# Patient Record
Sex: Female | Born: 1985 | Race: White | Hispanic: No | Marital: Married | Smoking: Former smoker
Health system: Southern US, Community
[De-identification: ages and names within clinical notes are randomized; demographics above are authoritative.]

## PROBLEM LIST (undated history)

## (undated) DIAGNOSIS — G43009 Migraine without aura, not intractable, without status migrainosus: Secondary | ICD-10-CM

## (undated) DIAGNOSIS — J189 Pneumonia, unspecified organism: Secondary | ICD-10-CM

## (undated) DIAGNOSIS — O24419 Gestational diabetes mellitus in pregnancy, unspecified control: Secondary | ICD-10-CM

## (undated) DIAGNOSIS — T8859XA Other complications of anesthesia, initial encounter: Secondary | ICD-10-CM

## (undated) DIAGNOSIS — F32A Depression, unspecified: Secondary | ICD-10-CM

## (undated) DIAGNOSIS — F329 Major depressive disorder, single episode, unspecified: Secondary | ICD-10-CM

## (undated) DIAGNOSIS — F419 Anxiety disorder, unspecified: Secondary | ICD-10-CM

## (undated) DIAGNOSIS — T4145XA Adverse effect of unspecified anesthetic, initial encounter: Secondary | ICD-10-CM

## (undated) HISTORY — DX: Depression, unspecified: F32.A

## (undated) HISTORY — DX: Anxiety disorder, unspecified: F41.9

## (undated) HISTORY — DX: Major depressive disorder, single episode, unspecified: F32.9

## (undated) HISTORY — DX: Migraine without aura, not intractable, without status migrainosus: G43.009

## (undated) HISTORY — DX: Gestational diabetes mellitus in pregnancy, unspecified control: O24.419

---

## 2006-12-26 ENCOUNTER — Emergency Department: Payer: Self-pay | Admitting: Emergency Medicine

## 2012-01-27 HISTORY — PX: WISDOM TOOTH EXTRACTION: SHX21

## 2012-07-11 ENCOUNTER — Emergency Department: Payer: Self-pay | Admitting: Emergency Medicine

## 2012-07-11 LAB — COMPREHENSIVE METABOLIC PANEL
Albumin: 4.2 g/dL (ref 3.4–5.0)
BUN: 8 mg/dL (ref 7–18)
Bilirubin,Total: 0.5 mg/dL (ref 0.2–1.0)
Creatinine: 0.77 mg/dL (ref 0.60–1.30)
EGFR (African American): >60
Osmolality: 273 (ref 275–301)
SGOT(AST): 11 U/L — ABNORMAL LOW (ref 15–37)
Sodium: 138 mmol/L (ref 136–145)

## 2012-07-11 LAB — URINALYSIS, COMPLETE
Bilirubin,UR: NEGATIVE
Glucose,UR: NEGATIVE mg/dL (ref 0–75)
Nitrite: NEGATIVE
Ph: 8 (ref 4.5–8.0)
RBC,UR: 1 /HPF (ref 0–5)
Specific Gravity: 1.01 (ref 1.003–1.030)
Squamous Epithelial: 7
WBC UR: <1 /HPF (ref 0–5)

## 2012-07-11 LAB — CBC
MCH: 32.9 pg (ref 26.0–34.0)
MCHC: 35 g/dL (ref 32.0–36.0)
RBC: 4.27 10*6/uL (ref 3.80–5.20)
RDW: 13.4 % (ref 11.5–14.5)
WBC: 11.4 10*3/uL — ABNORMAL HIGH (ref 3.6–11.0)

## 2013-11-26 LAB — HM PAP SMEAR

## 2014-02-06 ENCOUNTER — Ambulatory Visit: Payer: Self-pay | Admitting: Obstetrics and Gynecology

## 2014-02-26 ENCOUNTER — Ambulatory Visit: Payer: Self-pay | Admitting: Obstetrics and Gynecology

## 2014-03-06 ENCOUNTER — Inpatient Hospital Stay: Payer: Self-pay

## 2014-05-22 DIAGNOSIS — F419 Anxiety disorder, unspecified: Principal | ICD-10-CM

## 2014-05-22 DIAGNOSIS — F329 Major depressive disorder, single episode, unspecified: Secondary | ICD-10-CM | POA: Insufficient documentation

## 2014-05-22 DIAGNOSIS — F32A Depression, unspecified: Secondary | ICD-10-CM

## 2014-05-27 NOTE — Consult Note (Signed)
PATIENT NAME:  Alexis Petersen, Alexis Petersen MR#:  [id number] DATE OF BIRTH:  1985-12-29  DATE OF CONSULTATION:  03/09/2014  REFERRING PHYSICIAN:  Prentice DockerMartin A. DeFrancesco, MD  CONSULTING PHYSICIAN:  Katharina Caperima Anatole Apollo, MD  REASON FOR CONSULTATION: The patient was admitted on 03/06/2014 for a C-section due to a 39.5 week intrauterine pregnancy. Consult was requested by Dr. Greggory KeeneFrancesco.   HISTORY OF PRESENT ILLNESS: DATE OF BIRTH: The patient is a 29 year old Caucasian female with history of gestational diabetes mellitus, history of tobacco abuse, and intrauterine growth retardation, who presented to the hospital for C-section at 39.5 weeks and underwent operation on 03/06/2014, has been coughing and producing some phlegm. Apparently, the patient was diagnosed with bronchitis, as well as ear infection and received some cephalosporin, at least 3 courses, over the past 3-4 months. Now, post C-section, she started coughing more and producing some phlegm. She denies any, however, significant fevers or chills. Admits of having some wheezing in her lungs, as well as some shortness of breath; however, states that her shortness of breath could be related to her nasal congestion, as well. The patient underwent x-ray evaluation of her lungs and was shown to have left lower lobe pneumonia. Hospitalist services were contacted for consultation.   PAST MEDICAL HISTORY: The patient has history of gestational diabetes mellitus, tobacco abuse, intrauterine growth retardation, C-section for 39.5 weeks intrauterine pregnancy.   MEDICATIONS: According to medical records, the patient is on lactated Ringer's solution IV, Norco as needed, Benadryl, Colace, iron sulfate, Dilaudid, ibuprofen, lidocaine patch, prenatal multivitamins, promethazine, levofloxacin.   ALLERGIES: THE PATIENT ADMITS OF ALLERGIES TO BENADRYL, AS WELL AS SULFA, AS WELL AS PENICILLIN.   PAST SURGICAL HISTORY: She also had wisdom teeth removed, and a lot of ear as well as  throat infections.   FAMILY HISTORY: Significant for history of mother who had stroke at a young age. She also had high blood pressure. The patient's father had a MI x 2 at the age of 31. No cancers. No diabetes in the family.   SOCIAL HISTORY: The patient is married, lives with her husband. Smoked 1 to 1-1/2 packs a day for 10 years, then quit, and now she smokes approximately 1-2 cigarettes a day, still smokes despite her pregnancy. Occasional alcohol use. She is unemployed at present. In the past, she was a cook.    REVIEW OF SYSTEMS:  CONSTITUTIONAL: Positive for weight loss, approximately 40 pounds, which she said was felt due to peptic ulcer disease.  EARS, NOSE, AND THROAT: She admits of having some blurry vision, because of allergies. Admits of having some sinus congestion. Also states that she has year-round allergies, admits of cough, as well as wheezes, as well as shortness of breath, some whitish thick phlegm. Denies any high  fevers, fatigue, weakness, pains, double vision, glaucoma, or cataracts. Denies any tinnitus, allergies, epistaxis, sinus pain, dentures difficulty swallowing.  RESPIRATORY: Denies any hemoptysis, asthma, COPD.  CARDIOVASCULAR: Denies chest pains, orthopnea, edema, arrhythmias, palpitations or syncope.  GASTROINTESTINAL:  Denies any nausea, vomiting, diarrhea, or constipation.  GENITOURINARY: Denies dysuria, hematuria, frequency, incontinence.  ENDOCRINOLOGY: Denies polydipsia, nocturia, thyroid problems, heat or cold intolerance, or thirst.  HEMATOLOGICAL: Denies anemia, easy bruising, bleeding, swollen glands.  SKIN: Denies any acne, rashes, lesions, moles.  MUSCULOSKELETAL: Denies arthritis, joint swelling.  NEUROLOGICAL: No numbness, epilepsy, or tremors.  PSYCHIATRIC: Denies anxiety, insomnia, or depression.   PHYSICAL EXAMINATION: VITAL SIGNS: During my evaluation, the patient's vital signs: Temperature 98.3, pulse was 95, respiration was 18, blood  pressure 110/70, saturation was 98% on room air at rest.  GENERAL: This is a well-developed, well-nourished, mildly obese Caucasian female in no significant distress, sitting on the stretcher.  HEENT: Her pupils are equal, reactive to light. Extraocular muscles intact. No icterus or conjunctivitis. Has normal hearing. No pharyngeal erythema. Mucosa is moist.  NECK: No masses. Supple, nontender. Thyroid not enlarged. No adenopathy. No JVD or carotid bruits bilaterally. Full  range of motion. LUNGS: :Rales and rhonchi were heard in the left side, somewhat diminished breath sounds, but  otherwise no significant wheezing. No labored inspirations, increased effort, dullness to percussion. Not in overt respiratory distress, quite relatively quiet, no significant rales, a few rhonchi were heard on the right side.  CARDIOVASCULAR: S1, S2 appreciated. The rhythm is regular. PMI not lateralized. Chest is nontender to palpation.  EXTREMITIES: 1+ pedal pulses. Trace to 1+ lower extremity edema. No calf tenderness or cyanosis noted.  ABDOMEN: Soft, nontender. Bowel sounds are present. No hepatosplenomegaly or masses were noted. The patient does have lower abdominal area incision, which was dressed and no bleeding, no significant swelling was noted, or pain on palpation. Bowel sounds were present.  MUSCULOSKELETAL: Able to move all extremities. No cyanosis, degenerative joint disease, or kyphosis. Gait was not tested.  SKIN: Did not reveal any rashes, lesions, erythema, nodularity, or induration. It was warm and dry to palpation.  LYMPHATIC: No adenopathy in the cervical region.  NEUROLOGIC: Cranial nerves grossly intact. Sensory intact. No dysarthria or aphasia. The patient is alert, oriented to time, person, place, cooperative. Memory is good.  PSYCHIATRIC: No significant confusion, agitation, or depression noted.   LABORATORY DATA: CBC on 03/06/2014 showed white cell count elevated to 17.4, hemoglobin 11.9,  platelet count was 449,000,  hematocrit level of 36.2, hemoglobin level was checked on 03/08/2014 was 32.0, creatinine was 0.53 on 03/08/2014.   RADIOLOGIC STUDIES: Chest x-ray PA and lateral, 03/09/2014, revealed mild left basilar subsegmental atelectasis or pneumonia.   ASSESSMENT AND PLAN: 1. Left lower lobe pneumonia due to unknown etiologic agent at present. Agree with Levaquin. Continue Levaquin , start DuoNeb round the clock to facilitate expectoration. We will follow clinically.  2. Leukocytosis, nonspecified. Follow up with therapy. 3. Tobacco abuse. Discussed with patient for approximately 5 minutes, initiate nicotine replacement therapy, for which he was agreeable.  4. Gestational diabetes mellitus. Continue sliding scale or Accu-Cheks according to GYN recommendation.   Thank you for the consult. We will follow the patient along.   TIME SPENT: 50 minutes.    ____________________________ Katharina Caper, MD rv:mw D: 03/09/2014 10:00:44 ET T: 03/09/2014 11:47:45 ET JOB#: [id number]  cc: Katharina Caper, MD, <Dictator> Cobe Viney MD ELECTRONICALLY SIGNED 04/10/2014 10:42

## 2014-05-27 NOTE — Consult Note (Signed)
Brief Consult Note: Diagnosis: LLL bacterial pneumonia of unknown etiologic agent at present, leukocytosis, tobaccoa bsue, ongoing, gestational diabetes.   Patient was seen by consultant.   Consult note dictated.   Recommend further assessment or treatment.   Orders entered.   Comments: 1. LLL pneumonia due to unknown etiologic agent at present, agree with levaquin, start duonebs, humibid to facilitate expectoration, follow clinically 2. leukocytosis, NOS, follow with therapy 3. tobacco abuse, d/w pt for 5 minnutes, intiated nicotine replacement therapy 4.gestational diabetes, SSI/accucheks  Thank you for consult, we'll follow.  Electronic Signatures: Katharina CaperVaickute, Saachi Zale (MD)  (Signed 12-Feb-16 10:01)  Authored: Brief Consult Note   Last Updated: 12-Feb-16 10:01 by Katharina CaperVaickute, Micahel Omlor (MD)

## 2014-05-27 NOTE — Op Note (Signed)
PATIENT NAME:  Alexis Petersen, Alexis Petersen MR#:  [id number] DATE OF BIRTH:  [birth year]  DATE OF PROCEDURE:  03/06/2014  PREOPERATIVE DIAGNOSES:  1. A 39.5 week intrauterine pregnancy, undelivered.  2. History of intrauterine growth restriction.  3. Gestational diabetes mellitus. 4. Tobacco user.  5. Rh negative.   POSTOPERATIVE DIAGNOSES:  1. A 39.5 week intrauterine pregnancy, delivered.  2. Gestational diabetes mellitus. 3. History of intrauterine growth restriction.  4. Tobacco user.  5. Rh negative.  6. Viable female infant, 5 pounds, 13 ounces.   OPERATIVE PROCEDURE: Primary low cervical transverse cesarean section.   SURGEON: Prentice DockerMartin A. Aleksandar Duve, M.D.   FIRST ASSISTANT: Ulyses AmorMelody N. Burr, certified nurse midwife.   ANESTHESIA: Epidural.   INDICATIONS: The patient is a 29 year old, white female, gravida 1, para 0 at 39.[redacted] weeks gestation, who was admitted for induction of labor secondary to gestational diabetes mellitus, history of IUGR, tobacco user, and Rh negative status. During induction of labor, the patient developed repetitive late decelerations remote from vaginal delivery. Because the patient was 3, 80% and -3 on examination, and because there was molding of the head, decision was made to proceed with operative abdominal delivery.   FINDINGS AT SURGERY: Revealed a viable female infant, 5 pounds 13 ounces, having Apgar's of 5 and 9 at 1 and 5 minutes respectively. The uterus, tubes, and ovaries were grossly normal.   DESCRIPTION OF PROCEDURE: The patient was brought to the operating room where she was placed in the supine position. A right lateral hip roll was placed. Epidural was loaded. A Foley catheter was draining clear yellow urine from the bladder. After a ChloraPrep abdominal and perineal prep and drape, and after checking for adequate level of anesthesia, a Pfannenstiel incision was made in the abdomen. The fascia was incised transversely and extended bilaterally with Mayo scissors.  The midline raphe was incised, separated and the peritoneum was entered. A low transverse incision was made in the uterus and this was extended both cephalad and caudad in standard fashion. The infant was delivered through a vertex presentation, OP position. The umbilical cord was doubly clamped and cut and the infant was handed off to the awaiting resuscitation team. Apgar's were 5 and 9 at 1 and 5 minutes, respectively. Cord pH was obtained. Cord blood sampling was also obtained for typing. The placenta was expressed from the uterine cavity. The uterus was externalized onto the anterior abdominal wall and was cleared of all debris with laps. The incision was closed in 1 layer using a #1 chromic suture in a running locking manner. Several other figure-of-eight sutures were used to help optimize hemostasis. The uterus was then placed back into the abdominopelvic cavity. Gutters were cleared of all debris with laps. The incision was then closed in layers with 0 Maxon being used on the fascia in a simple running manner. The skin was closed with a 4-0 Vicryl suture in a subcuticular manner. Dermabond glue was placed over the incision. A Lidoderm patch was placed on the skin and a dressing was placed over the incision.   The patient was then mobilized and taken to the recovery room in satisfactory condition.   ESTIMATED BLOOD LOSS: 500 mL.   INTRAVENOUS FLUIDS: Not quantified.   URINE OUTPUT: Not quantified at the time of this dictation.   All instruments, needle, and sponge counts were verified as correct. The patient did receive clindamycin antibiotic prophylaxis.    ____________________________ Prentice DockerMartin A. Anjalee Cope, MD mad:JT D: 03/07/2014 23:11:00 ET T: 03/08/2014 10:23:32 ET  JOB#: [id number]  cc: Daphine Deutscher A. Elvis Boot, MD, <Dictator> [facility] Prentice Docker Miho Monda MD ELECTRONICALLY SIGNED 03/28/2014 13:44

## 2014-06-05 NOTE — H&P (Signed)
L&D Evaluation:  History:  HPI 29yo MWF presents at 3942w3d for IOL secondary to diet controlled GDM, G1 P0000; Prenatal care complicated by smoking and chronic bronchitis   Patient's Medical History No Chronic Illness  Depression   Patient's Surgical History wisdom teeth extraction   Medications Pre Natal Vitamins  Tylenol (Acetaminophen)  Zofran   Allergies PCN, sulfa, benedryl   Social History tobacco   Family History Non-Contributory   ROS:  ROS All systems were reviewed.  HEENT, CNS, GI, GU, Respiratory, CV, Renal and Musculoskeletal systems were found to be normal.   Exam:  Vital Signs stable   General no apparent distress   Mental Status clear   Chest clear   Heart normal sinus rhythm   Abdomen gravid, tender with contractions   Estimated Fetal Weight Average for gestational age   Fetal Position vtx   Back CVAT   Edema 1+   Mebranes Intact   FHT normal rate with no decels   Fetal Heart Rate 134   Ucx irregular   Length of each Contraction 30 seconds   Ucx Pain Scale 0   Skin dry   Impression:  Impression GDM IOLat 742w3d   Plan:  Plan cytotec IOL   Electronic Signatures: Ulyses AmorBurr, Gerard Cantara N (CNM)  (Signed 09-Feb-16 20:14)  Authored: L&D Evaluation   Last Updated: 09-Feb-16 20:14 by Ulyses AmorBurr, Felis Quillin N (CNM)

## 2014-07-18 ENCOUNTER — Ambulatory Visit (INDEPENDENT_AMBULATORY_CARE_PROVIDER_SITE_OTHER): Payer: PRIVATE HEALTH INSURANCE

## 2014-07-18 VITALS — BP 103/68 | HR 80 | Ht 64.0 in | Wt 147.0 lb

## 2014-07-18 DIAGNOSIS — Z3042 Encounter for surveillance of injectable contraceptive: Secondary | ICD-10-CM

## 2014-07-18 MED ORDER — MEDROXYPROGESTERONE ACETATE 150 MG/ML IM SUSP
150.0000 mg | Freq: Once | INTRAMUSCULAR | Status: AC
  Administered 2014-07-18: 150 mg via INTRAMUSCULAR

## 2014-07-18 NOTE — Progress Notes (Signed)
Date last pap: 02/2013 [facility]. Last Depo-Provera: 04/18/2014 Side Effects if any: Pt denies side effects or compliants Serum HCG indicated? N/A Depo-Provera 150 mg IM given by: Debbe Bales Next appointment due between the window of September 7 - September 21  Pt tolerated injection well. Within window so no UPT performed. Pt instructed to f/u in 3 months.

## 2014-08-21 ENCOUNTER — Encounter: Payer: Self-pay | Admitting: *Deleted

## 2014-08-22 ENCOUNTER — Encounter: Payer: Self-pay | Admitting: Obstetrics and Gynecology

## 2014-08-22 ENCOUNTER — Ambulatory Visit (INDEPENDENT_AMBULATORY_CARE_PROVIDER_SITE_OTHER): Payer: PRIVATE HEALTH INSURANCE | Admitting: Obstetrics and Gynecology

## 2014-08-22 VITALS — BP 121/80 | HR 85 | Ht 64.0 in | Wt 145.7 lb

## 2014-08-22 DIAGNOSIS — F329 Major depressive disorder, single episode, unspecified: Secondary | ICD-10-CM | POA: Diagnosis not present

## 2014-08-22 DIAGNOSIS — R5383 Other fatigue: Secondary | ICD-10-CM | POA: Insufficient documentation

## 2014-08-22 DIAGNOSIS — R208 Other disturbances of skin sensation: Secondary | ICD-10-CM | POA: Diagnosis not present

## 2014-08-22 DIAGNOSIS — F32A Depression, unspecified: Secondary | ICD-10-CM

## 2014-08-22 DIAGNOSIS — Z01419 Encounter for gynecological examination (general) (routine) without abnormal findings: Secondary | ICD-10-CM | POA: Diagnosis not present

## 2014-08-22 DIAGNOSIS — L7682 Other postprocedural complications of skin and subcutaneous tissue: Secondary | ICD-10-CM | POA: Insufficient documentation

## 2014-08-22 MED ORDER — FLUOXETINE HCL 10 MG PO TABS: 10.0000 mg | ORAL_TABLET | Freq: Every day | ORAL | Status: DC

## 2014-08-22 MED ORDER — ALPRAZOLAM 0.5 MG PO TABS: 0.5000 mg | ORAL_TABLET | Freq: Three times a day (TID) | ORAL | Status: DC | PRN

## 2014-08-22 NOTE — Progress Notes (Signed)
  Subjective:     Alexis Petersen is a 29 y.o. female and is here for a comprehensive physical exam. The patient reports problems - worsening anxiety and panic attacks weekly. Also reports intermittent but sharp burning pains above right margin of incision.  History   Social History  . Marital Status: Single    Spouse Name: N/A  . Number of Children: N/A  . Years of Education: N/A   Occupational History  . Not on file.   Social History Main Topics  . Smoking status: Current Every Day Smoker -- 0.50 packs/day for 12 years    Types: Cigarettes  . Smokeless tobacco: Never Used  . Alcohol Use: No  . Drug Use: No  . Sexual Activity: Yes    Birth Control/ Protection: Injection     Comment: depo provera   Other Topics Concern  . Not on file   Social History Narrative   Health Maintenance  Topic Date Due  . HIV Screening  11/22/1998  . PAP SMEAR  11/21/2001  . TETANUS/TDAP  11/22/2002  . INFLUENZA VACCINE  08/27/2014    The following portions of the patient's history were reviewed and updated as appropriate: allergies, current medications, past family history, past medical history, past social history, past surgical history and problem list.  Review of Systems A comprehensive review of systems was negative except for: Behavioral/Psych: positive for anxiety   Objective:    General appearance: alert, cooperative and appears stated age Throat: lips, mucosa, and tongue normal; teeth and gums normal Lungs: clear to auscultation bilaterally Breasts: normal appearance, no masses or tenderness Heart: regular rate and rhythm, S1, S2 normal, no murmur, click, rub or gallop Abdomen: soft, non-tender; bowel sounds normal; no masses,  no organomegaly Pelvic: cervix normal in appearance, external genitalia normal, no adnexal masses or tenderness, no cervical motion tenderness, rectovaginal septum normal, uterus normal size, shape, and consistency and vagina normal without discharge     Assessment:    Healthy female exam. Postpartum anxiety with panic attacks   muscle spasms and incisional pain   Plan:  Pap obtained Routine screening labs obtained Counseled on anxiety medications- RX for prozac  daily and xanax 0.5mg  PRN use given, to f/u in 6 weeks or sooner if needed. PT referral for incisional pain   See After Visit Summary for Counseling Recommendations  Melody Ines Bloomer, CNM

## 2014-08-23 ENCOUNTER — Encounter: Payer: Self-pay | Admitting: *Deleted

## 2014-08-23 LAB — VITAMIN B12: Vitamin B-12: 634 pg/mL (ref 211–946)

## 2014-08-23 LAB — CBC
Hematocrit: 45.1 % (ref 34.0–46.6)
Hemoglobin: 14.7 g/dL (ref 11.1–15.9)
MCH: 30.4 pg (ref 26.6–33.0)
MCHC: 32.6 g/dL (ref 31.5–35.7)
MCV: 93 fL (ref 79–97)
PLATELETS: 398 10*3/uL — AB (ref 150–379)
RBC: 4.84 x10E6/uL (ref 3.77–5.28)
RDW: 14.4 % (ref 12.3–15.4)
WBC: 10 10*3/uL (ref 3.4–10.8)

## 2014-08-23 LAB — THYROID PANEL WITH TSH
Free Thyroxine Index: 2.1 (ref 1.2–4.9)
T3 Uptake Ratio: 28 % (ref 24–39)
T4, Total: 7.5 ug/dL (ref 4.5–12.0)
TSH: 0.936 u[IU]/mL (ref 0.450–4.500)

## 2014-08-23 LAB — COMPREHENSIVE METABOLIC PANEL
ALK PHOS: 65 IU/L (ref 39–117)
ALT: 19 IU/L (ref 0–32)
AST: 14 IU/L (ref 0–40)
Albumin/Globulin Ratio: 2.2 (ref 1.1–2.5)
Albumin: 4.7 g/dL (ref 3.5–5.5)
BILIRUBIN TOTAL: 0.3 mg/dL (ref 0.0–1.2)
BUN / CREAT RATIO: 8 (ref 8–20)
BUN: 8 mg/dL (ref 6–20)
CO2: 20 mmol/L (ref 18–29)
CREATININE: 0.99 mg/dL (ref 0.57–1.00)
Calcium: 9.5 mg/dL (ref 8.7–10.2)
Chloride: 103 mmol/L (ref 97–108)
GFR calc Af Amer: 88 mL/min/{1.73_m2} (ref >59)
GFR, EST NON AFRICAN AMERICAN: 77 mL/min/{1.73_m2} (ref >59)
GLUCOSE: 86 mg/dL (ref 65–99)
Globulin, Total: 2.1 g/dL (ref 1.5–4.5)
Potassium: 4.5 mmol/L (ref 3.5–5.2)
SODIUM: 143 mmol/L (ref 134–144)
Total Protein: 6.8 g/dL (ref 6.0–8.5)

## 2014-08-23 LAB — CORTISOL: Cortisol: 18 ug/dL

## 2014-08-23 LAB — VITAMIN D 25 HYDROXY (VIT D DEFICIENCY, FRACTURES): VIT D 25 HYDROXY: 36.9 ng/mL (ref 30.0–100.0)

## 2014-08-24 ENCOUNTER — Encounter: Payer: Self-pay | Admitting: Obstetrics and Gynecology

## 2014-08-30 ENCOUNTER — Ambulatory Visit: Payer: 59 | Attending: Obstetrics and Gynecology | Admitting: Physical Therapy

## 2014-08-30 ENCOUNTER — Encounter: Payer: Self-pay | Admitting: Physical Therapy

## 2014-08-30 DIAGNOSIS — R279 Unspecified lack of coordination: Secondary | ICD-10-CM | POA: Insufficient documentation

## 2014-08-30 DIAGNOSIS — M629 Disorder of muscle, unspecified: Secondary | ICD-10-CM | POA: Insufficient documentation

## 2014-08-30 NOTE — Patient Instructions (Addendum)
                 Handout for abdominal massage                                                  Preserve the function of your pelvic floor, abdomen, and back.              Avoid decreased straining of abdominal/pelvic floor muscles with less slouching,  holding your breath with lifting/bowel movements)

## 2014-08-31 NOTE — Therapy (Addendum)
[place] MAIN [facility] SERVICES [address] [place], [place], [address] Phone: [phone]   Fax:  [phone]  Physical Therapy Evaluation  Patient Details  Name: Alexis Petersen MRN: [id number] Date of Birth: 07-Jul-1985 Referring Provider:  Evonnie Pat, CNM  Encounter Date: 08/30/2014      PT End of Session - 09/04/14 2212    Visit Number 1   Number of Visits 12   Date for PT Re-Evaluation 11/27/14   PT Start Time 1100   PT Stop Time 1210   PT Time Calculation (min) 70 min   Activity Tolerance Patient tolerated treatment well;No increased pain   Behavior During Therapy [facility] for tasks assessed/performed      Past Medical History  Diagnosis Date  . Anxiety and depression   . Anxiety   . Depression     Past Surgical History  Procedure Laterality Date  . Wisdom tooth extraction  2014  . Cesarean section  2016    There were no vitals filed for this visit.  Visit Diagnosis:  Fascial defect  Lack of coordination      Subjective Assessment - 09/04/14 2214    Subjective (p) Pt delivered via C-section on 03/07/14 a 5 lb baby baby and she currently experiences incisonal pain 4-5/10 with sensitivity of touch (clothing, shaving) and when baby kicks the area above the incision on R LQ abdomen. The pain also impacts bending forward and lifting baby.  Pt also experience LBP at 2/10 mostly in the morning and pt thinks it is due her sleeping position without pillow between knees, increased activities during the day.  Pt feels it is stiff. Lastly pt experiences dyspareunia 2/10 mostly with entry and certain positions  where she is "folded up".    Pertinent History (p) Hx of sciatic pain during pregnancy but not radiating pain currently. Hx of fall on tailbone multiple times. Fx L ankle 6-7 yrs ago from jumping of a curb (immobilized 5-6 weeks and healed naturally).             [facility] PT Assessment - 09/04/14 2206    Assessment    Medical Diagnosis incisional pain   Precautions   Precautions None   Restrictions   Weight Bearing Restrictions No   Home Environment   Living Environment Private residence   Home Access --  6-7 stairs, with rail    Prior Function   Level of Independence Independent   Observation/Other Assessments   Other Surveys  --  PSFS: dressing 4/10, bend/lift 3/10, shaving 7/10, sex 8/10   Posture/Postural Control   Posture Comments abdominal straining w/ cue ffor bowel movement, slight lift palpated at medial ish tub for cue for stop of urine   ROM / Strength   AROM / PROM / Strength --  WNL AROM spinal   Palpation   Spinal mobility SIJ pain w/ forward bend, ext (higning T12/L1), side bend, rotation Bilaterally  T7-12 hypomobility w/ tenderness PAVM, increased mm tension    SI assessment  L ASIS more anterior, L malleoli higher > R. malleoli equal w/ sit up test   Post-Tx: symmetry achieved   Palpation comment LQ abdominal scar w/ flinching tenderness w. tight palpation to R lateral side. Tenderness to area above R side but tolerable   Bed Mobility   Supine to Sit --  crunching method. cued for log roll 2x rep  Pelvic Floor Special Questions - 09/04/14 2207    Diastasis Recti neg          [facility] Adult PT Treatment/Exercise - 09/04/14 2206    Manual Therapy   Joint Mobilization MET on L                      PT Long Term Goals - 09/04/14 2208    PT LONG TERM GOAL #1   Title Pt will increase her score on PSFS: dressing 4/10 to > 8/10, bend/lift 3/10 > 6/10 , shaving 7/10 > 10/10, sex 8/10 > 10/10 in order to return to ADLs.   Time 12   Period Weeks   Status New   PT LONG TERM GOAL #2   Title Pt will demo no flinching w/ palpation to R lateral side of abdominal scar in order to tolerate tighter clothes wearing.    Time 12   Period Weeks   Status New   PT LONG TERM GOAL #3   Title Pt will demo proper deep core coordination with lifting 30# 5  reps using proper body mechanics to demo ability to lift baby and car seat with less risk for injuries.   Time 12   Period Weeks   Status New               Plan - 09/04/14 2213    Clinical Impression Statement Pt is a 29 yo female whose S & Sx consist of post-surgical C-section scar pain, pelvic obliquities, decreased fascial mobility along LQ abdominal scar, tenderness to palpation along scar, and poor deep core coordination and strength with functional activities. These deficits limit her ability to perform ADLs and caring for her child.     Pt will benefit from skilled therapeutic intervention in order to improve on the following deficits Abnormal gait;Decreased activity tolerance;Decreased strength;Decreased mobility;Hypomobility;Decreased scar mobility;Improper body mechanics;Increased fascial restricitons;Decreased range of motion;Decreased coordination;Decreased endurance;Decreased safety awareness;Increased muscle spasms;Pain;Decreased balance;Postural dysfunction;Impaired flexibility   Rehab Potential Good   PT Frequency 1x / week   PT Duration 12 weeks   PT Treatment/Interventions ADLs/Self Care Home Management;Aquatic Therapy;Biofeedback;Cryotherapy;Moist Heat;Stair training;Gait training;Functional mobility training;Patient/family education;Scar mobilization;Neuromuscular re-education;Balance training;Manual techniques;Therapeutic exercise;Therapeutic activities;Dry needling;Energy conservation   Consulted and Agree with Plan of Care Patient         Problem List Patient Active Problem List   Diagnosis Date Noted  . Well woman exam with routine gynecological exam 08/22/2014  . Fatigue 08/22/2014  . Incisional pain 08/22/2014  . Anxiety and depression 05/22/2014    Jerl Mina ,PT, DPT, E-RYT  09/04/2014, 10:39 PM  [place] MAIN [facility] SERVICES [address] [place], [place], [address] Phone: [phone]   Fax:   [phone]

## 2014-09-04 NOTE — Addendum Note (Signed)
Addended by: Mariane Masters on: 09/04/2014 10:42 PM   Modules accepted: Orders

## 2014-09-05 ENCOUNTER — Ambulatory Visit: Payer: 59 | Admitting: Physical Therapy

## 2014-09-12 ENCOUNTER — Ambulatory Visit: Payer: 59 | Admitting: Physical Therapy

## 2014-09-12 DIAGNOSIS — R279 Unspecified lack of coordination: Secondary | ICD-10-CM

## 2014-09-12 DIAGNOSIS — M629 Disorder of muscle, unspecified: Secondary | ICD-10-CM

## 2014-09-12 NOTE — Patient Instructions (Signed)
Reverse kegel Abdominal massage  Diaphragmatic breathing with pelvic floor relaxation in sitting and intercourse   Use of pillows propped under hips with breathing exercise and abdominal massage and intercourse positions.   Standing 45 deg to right when at changing table to avoid baby from kicking R side of scar  Exhale with lifting baby

## 2014-09-13 NOTE — Therapy (Signed)
[facility] MAIN [facility] SERVICES [address] [place], [place], [address] Phone: [phone]   Fax:  [phone]  Physical Therapy Treatment  Patient Details  Name: Alexis Petersen MRN: [id number] Date of Birth: July 28, 1985 Referring Provider:  Ulyses Amor, CNM  Encounter Date: 09/12/2014      PT End of Session - 09/13/14 1157    Visit Number 2   Number of Visits 12   Date for PT Re-Evaluation 11/27/14   PT Start Time 1300   PT Stop Time 1400   PT Time Calculation (min) 60 min   Activity Tolerance Patient tolerated treatment well;No increased pain   Behavior During Therapy [facility] for tasks assessed/performed      Past Medical History  Diagnosis Date  . Anxiety and depression   . Anxiety   . Depression     Past Surgical History  Procedure Laterality Date  . Wisdom tooth extraction  2014  . Cesarean section  2016    There were no vitals filed for this visit.  Visit Diagnosis:  Fascial defect  Lack of coordination      Subjective Assessment - 09/12/14 1315    Subjective Pt reported her LBP does not bother her anymore and  had noticed an instant change with the manual treament she received at last session.     Pertinent History Hx of sciatic pain during pregnancy but not radiating pain currently. Hx of fall on tailbone multiple times. Fx L ankle 6-7 yrs ago from jumping of a curb (immobilized 5-6 weeks and healed naturally).    Currently in Pain? Yes   Pain Score 2             [facility] PT Assessment - 09/13/14 0001    Palpation   SI assessment  symmetry oof pelvic girdle and LE length   Palpation comment no flinching w./ palpation over scar today                  Pelvic Floor Special Questions - 09/13/14 1150    Pelvic Floor Internal Exam pt consented verbally and stated no contraindications   Exam Type Vaginal   Palpation increased mm tensions 3rd layer   dorsalbladder position blocking circumferential  contraction    Biofeedback difficulty relaxing pelvic floor   delayed lengthening of pelvic floor mm            [facility] Adult PT Treatment/Exercise - 09/13/14 1150    Therapeutic Activites    ADL's semi tandem stance with R 45 deg turn to avoid baby from kicking abdominal scar    Lifting exhaling when lifting 10# dubbell 5 reps   Other Therapeutic Activities use of pillow under hips for HEP, sexual intercourse to facilitate optimal  pelvic organ positioning    Neuro Re-ed    Neuro Re-ed Details  reverse kegel to release pelvic floor mm   Exercises   Exercises --   Manual Therapy   Myofascial Release abdominal massage, guided pt to perform self-massage    Internal Pelvic Floor sustained pressure, thiele massage 3rd layer bilateral                PT Education - 09/12/14 1401    Education provided Yes   Education Details HEP, diaphragmatic excursion for decreasing anxiety, dyspareunia, and postural alignment   Person(s) Educated Patient             PT Long Term Goals - 09/13/14 1201    PT LONG  TERM GOAL #1   Title Pt will increase her score on PSFS: dressing 4/10 to > 8/10, bend/lift 3/10 > 6/10 , shaving 7/10 > 10/10, sex 8/10 > 10/10 in order to return to ADLs.   Time 12   Period Weeks   Status New   PT LONG TERM GOAL #2   Title Pt will demo no flinching w/ palpation to R lateral side of abdominal scar in order to tolerate tighter clothes wearing.    Time 12   Period Weeks   Status achieved   PT LONG TERM GOAL #3   Title Pt will demo proper deep core coordination with lifting 30# 5 reps using proper body mechanics to demo ability to lift baby and car seat with less risk for injuries.   Time 12   Period Weeks   Status New   PT LONG TERM GOAL #4   Title Pt will report decrease pain with sexual intercourse 50% of the time in order to regain QOL.    Time 12   Period Weeks   Status New               Plan - 09/13/14 1157    Clinical Impression  Statement Pt demo'd symmetry at pelvic girdle joint, showing positive response to manual Tx at last session with report of resolved LBP. Pt also showed no flinching w/ palpation to C-section scar today as she has been compliant with HEP.  Pt demo'd delayed pelvic floor lengthening and increased mm tension through internal vaginal assessment.  This deficit in addition to abdominal scar immobility are both likely attributing to her dyspareunia complaints. 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  Anticipate pt will continue to progress towards her goals.    Pt will benefit from skilled therapeutic intervention in order to improve on the following deficits Abnormal gait;Decreased activity tolerance;Decreased strength;Decreased mobility;Hypomobility;Decreased scar mobility;Improper body mechanics;Increased fascial restrictions;Decreased range of motion;Decreased coordination;Decreased endurance;Decreased safety awareness;Increased muscle spasms;Pain;Decreased balance;Postural dysfunction;Impaired flexibility   Rehab Potential Good   PT Frequency 1x / week   PT Duration 12 weeks   PT Treatment/Interventions ADLs/Self Care Home Management;Aquatic Therapy;Biofeedback;Cryotherapy;Moist Heat;Stair training;Gait training;Functional mobility training;Patient/family education;Scar mobilization;Neuromuscular re-education;Balance training;Manual techniques;Therapeutic exercise;Therapeutic activities;Dry needling;Energy conservation   PT Next Visit Plan next visit in 2 weeks. initate dynamic stabilization, reassess pelvic floor    Consulted and Agree with Plan of Care Patient        Problem List Patient Active Problem List   Diagnosis Date Noted  . Well woman exam with routine gynecological exam 08/22/2014  . Fatigue 08/22/2014  . Incisional pain 08/22/2014  . Anxiety  and depression 05/22/2014    Mariane Masters ,PT, DPT, E-RYT  09/13/2014, 12:04 PM  [facility] MAIN [facility] SERVICES [address] [place], [place], [address] Phone: [phone]   Fax:  [phone]

## 2014-09-19 ENCOUNTER — Ambulatory Visit: Payer: 59 | Admitting: Physical Therapy

## 2014-09-26 ENCOUNTER — Ambulatory Visit: Payer: 59 | Admitting: Physical Therapy

## 2014-09-26 ENCOUNTER — Telehealth: Payer: Self-pay | Admitting: Obstetrics and Gynecology

## 2014-09-26 DIAGNOSIS — R279 Unspecified lack of coordination: Secondary | ICD-10-CM

## 2014-09-26 DIAGNOSIS — M629 Disorder of muscle, unspecified: Secondary | ICD-10-CM | POA: Diagnosis not present

## 2014-09-26 NOTE — Telephone Encounter (Signed)
This is from Dr. Dayle Points....Marland KitchenMarland KitchenGreat progress, thanks for sending the pt this dr. Dayle Points   She sent notes via EPIC.

## 2014-09-26 NOTE — Patient Instructions (Addendum)
Figure 4- stretch, cross over stretch , modified down dog on arm of chair (5 breaths) every 1-2  hrs when working at desk   Diaphragmatic/pelvic breathing prior, during, post-sexual intercourse.   Use water -based, non-glycerin lubricants during intercourse.   .    You are now ready to begin training the deep core muscles system: diaphragm, transverse abdominis, pelvic floor . These muscles must work together as a team.           The key to these exercises to train the brain to coordinate the timing of these muscles and to have them turn on for long periods of time to hold you upright against gravity (especially important if you are on your feet all day).These muscles are postural muscles and play a role stabilizing your spine and bodyweight. By doing these repetitions slowly and correctly instead of doing crunches, you will achieve a flatter belly without a lower pooch. You are also placing your spine in a more neutral position and breathing properly which in turn, decreases your risk for problems related to your pelvic floor, abdominal, and low back such as pelvic organ prolapse, hernias, diastasis recti (separation of superficial muscles), disk herniations, spinal fractures. These exercises set a solid foundation for you to later progress to resistance/ strength training with therabands and weights and return to other typical fitness exercises with a stronger deeper core.    Perform level 1-2

## 2014-09-26 NOTE — Therapy (Signed)
[facility] MAIN [facility] SERVICES [address] [place], [place], [address] Phone: [phone]   Fax:  [phone]  Physical Therapy Treatment  Patient Details  Name: Alexis Petersen MRN: [id number] Date of Birth: [birth year] Referring Provider:  Ulyses Amor, CNM  Encounter Date: 09/26/2014      PT End of Session - 09/26/14 1355    Visit Number 3   Number of Visits 12   Date for PT Re-Evaluation 11/27/14   PT Start Time 1300   PT Stop Time 1350   PT Time Calculation (min) 50 min   Activity Tolerance Patient tolerated treatment well;No increased pain   Behavior During Therapy [facility] for tasks assessed/performed      Past Medical History  Diagnosis Date  . Anxiety and depression   . Anxiety   . Depression     Past Surgical History  Procedure Laterality Date  . Wisdom tooth extraction  2014  . Cesarean section  2016    There were no vitals filed for this visit.  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 Pt states her abdominal C-section feels "better". Pt reported decreased pain with intercourse with remaining pain related to initial penetration  and post-coitus w/ menstrual cramp-like Sx and sensation of having to urinate during intercourse.  Pt has been able to perform HEP with difficulty on some busy days.              [facility] PT Assessment - 09/26/14 1352    Observation/Other Assessments   Other Surveys  --  PSFS: dressing 9/10, bend/lift 9/10, shaving 9/10, sex 9/10   Posture/Postural Control   Posture Comments self-corrected to upright posture 1x    Palpation   Palpation comment significantly mobile abdominal scar                   Pelvic Floor Special Questions - 09/26/14 1350    Pelvic Floor Internal Exam pt consented verbally and stated no contraindications   Exam  Type Vaginal   Palpation decreased mm tensions 3rd layer, L > R. dorsalbladder position blocking circumferential contraction. More circumferential contraction with pillow under hips Grade 4/5    able to squeeze 4/5 w/ cough   Biofeedback improved ability to relaxing pelvic floor            [facility] Adult PT Treatment/Exercise - 09/26/14 1352    Self-Care   Other Self-Care Comments  use of water-based, glycerin-free lubricants   Neuro Re-ed    Neuro Re-ed Details  diaphragmatic/pelvic ROM, dynamic stabilization 1-2  w/ pillow under hips   Exercises   Other Exercises  pelvic floor stretches (figure -4, cross over, down dog on chair for practice while at work) 5 breaths    Manual Therapy   Internal Pelvic Floor sustained pressure, thiele massage 3rd layer bilateral                PT Education - 09/26/14 1354    Education provided Yes   Education Details HEP   Person(s) Educated Patient   Methods Explanation;Demonstration;Tactile cues;Verbal cues;Handout   Comprehension Verbalized understanding;Returned demonstration             PT Long Term Goals - 09/26/14 1307    PT LONG TERM GOAL #1   Title Pt will increase her score on  PSFS: dressing 4/10 to > 8/10, bend/lift 3/10 > 6/10 , shaving 7/10 > 10/10, sex 8/10 > 10/10 in order to return to ADLs. (09/26/14: dressing 9/10, bend/lifting 9/10, shaving 9/10. 9/10)    Time 12   Period Weeks   Status Achieved   PT LONG TERM GOAL #2   Title Pt will demo no flinching w/ palpation to R lateral side of abdominal scar in order to tolerate tighter clothes wearing.    Time 12   Period Weeks   Status Achieved   PT LONG TERM GOAL #3   Title Pt will demo proper deep core coordination with lifting 30# 5 reps using proper body mechanics to demo ability to lift baby and car seat with less risk for injuries.   Time 12   Period Weeks   Status On-going   PT LONG TERM GOAL #4   Title Pt will report decrease pain with sexual intercourse 50%  of the time in order to regain QOL.    Time 12   Period Weeks   Status Achieved   PT LONG TERM GOAL #5   Title Pt will demo fascial tensigrity with more caudal/ventral positioning of bladder and a circumferential contraction without hips elevated on pillow in order to resolve sensation of having to urinate during intercourse.     Time 12   Period Weeks   Status New               Plan - 09/26/14 1355    Clinical Impression Statement Pt has acheived 3/5 goals (wearing tight clothing, shaving, having intercourse with decreased pain)  and is progressing well towards her remaining goals. Pt demo significantly improved C-section scar mobility, pelvic symmetries, diaphragmatic and pelvic excursion, and  decreased pelvic floor mm tensions. One of pt's remaining deficits include decreased abdominopelvic fascial tensigrity as indicated with more dorsal positioning of bladder in supine position. This deficit is likely impacting her c/o of need to urinate during intercourse. Another remaining deficit has to do with residual pelvic floor mm tensions of L obt internus./ anterior puborectalis which are likely associated with her c/o of slight pain with initial penetration and menstrual cramp like sensations post-coitus. Pt was educated and provided sample packets of water-based, non-glyercin lubricant. Initiated pt on deep core strengthening program/ pelvic floor stretching HEP which are suspected to help address her remaining deficits. 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       Pt will benefit from skilled therapeutic intervention in order to improve on the following deficits Abnormal gait;Decreased activity tolerance;Decreased strength;Decreased mobility;Hypomobility;Decreased scar mobility;Improper body mechanics;Increased fascial restricitons;Decreased range of motion;Decreased coordination;Decreased endurance;Decreased  safety awareness;Increased muscle spasms;Pain;Decreased balance;Postural dysfunction;Impaired flexibility   Rehab Potential Good   PT Frequency 1x / week   PT Duration 12 weeks   PT Treatment/Interventions ADLs/Self Care Home Management;Aquatic Therapy;Biofeedback;Cryotherapy;Moist Heat;Stair training;Gait training;Functional mobility training;Patient/family education;Scar mobilization;Neuromuscular re-education;Balance training;Manual techniques;Therapeutic exercise;Therapeutic activities;Dry needling;Energy conservation   PT Next Visit Plan next visit in 2 weeks. initate dynamic stabilization, reassess pelvic floor    Consulted and Agree with Plan of Care Patient        Problem List Patient Active Problem List   Diagnosis Date Noted  . Well woman exam with routine gynecological exam 08/22/2014  . Fatigue 08/22/2014  . Incisional pain 08/22/2014  . Anxiety and depression 05/22/2014    Mariane Masters ,PT, DPT, E-RYT  09/26/2014, 2:14 PM  [facility] MAIN [facility] SERVICES [address] [place], [place], [address] Phone: [phone]   Fax:  [phone]

## 2014-10-02 ENCOUNTER — Ambulatory Visit (INDEPENDENT_AMBULATORY_CARE_PROVIDER_SITE_OTHER): Payer: PRIVATE HEALTH INSURANCE | Admitting: Obstetrics and Gynecology

## 2014-10-02 ENCOUNTER — Encounter: Payer: Self-pay | Admitting: Obstetrics and Gynecology

## 2014-10-02 VITALS — BP 108/76 | HR 79 | Ht 64.0 in | Wt 150.2 lb

## 2014-10-02 DIAGNOSIS — Z79899 Other long term (current) drug therapy: Secondary | ICD-10-CM

## 2014-10-02 NOTE — Progress Notes (Signed)
Subjective:     Patient ID: Alexis Petersen, female   DOB: Feb 19, 1985, 29 y.o.   MRN: [id number]  HPI Started on Prozac  6 week ago and also xanax 0.5mg  prn for anxiety and panic episodes postpartum.  Review of Systems Reports a drastic reduction in panic attacks, and feeling less anxious, is happy with medication. Took 2 doses of xanax and did well with them. Is sleeping better.    Objective:   Physical Exam A&O x4 Well groomed, no distress noted, not anxious or jittery, thought process intact.     Assessment:     Anxiety under good control with current SSRI     Plan:     Continue medications at current dose.  RTC prn [place] Haub Ines Bloomer, CNM

## 2014-10-03 ENCOUNTER — Ambulatory Visit: Payer: 59 | Admitting: Physical Therapy

## 2014-10-05 ENCOUNTER — Ambulatory Visit (INDEPENDENT_AMBULATORY_CARE_PROVIDER_SITE_OTHER): Payer: PRIVATE HEALTH INSURANCE | Admitting: Obstetrics and Gynecology

## 2014-10-05 VITALS — BP 110/77 | HR 69 | Ht 64.0 in | Wt 149.3 lb

## 2014-10-05 DIAGNOSIS — Z3042 Encounter for surveillance of injectable contraceptive: Secondary | ICD-10-CM

## 2014-10-05 MED ORDER — MEDROXYPROGESTERONE ACETATE 150 MG/ML IM SUSP
150.0000 mg | Freq: Once | INTRAMUSCULAR | Status: AC
  Administered 2014-10-05: 150 mg via INTRAMUSCULAR

## 2014-10-05 NOTE — Progress Notes (Signed)
Patient ID: Alexis Petersen, female   DOB: [birth year], 29 y.o.   MRN: [id number] Pt here for depo-provera injection for contraception. No c/o side effects except a little soreness where last injection was given. No reddness or swelling noted in the left hip area.

## 2014-10-10 ENCOUNTER — Ambulatory Visit: Payer: 59 | Admitting: Physical Therapy

## 2014-10-11 ENCOUNTER — Ambulatory Visit: Payer: 59 | Attending: Obstetrics and Gynecology | Admitting: Physical Therapy

## 2014-10-11 DIAGNOSIS — R279 Unspecified lack of coordination: Secondary | ICD-10-CM | POA: Diagnosis present

## 2014-10-11 DIAGNOSIS — M629 Disorder of muscle, unspecified: Secondary | ICD-10-CM | POA: Diagnosis present

## 2014-10-11 NOTE — Patient Instructions (Signed)
Routine to incorporate w/ son  Strengthening:   1) mini squats while feeding him in high chair    2) side step w/ squats and lifting son overhead  Squeeze shoulder blades when lowering him to chest  5 lifts / 2 sets /day   3)  birddog  10 reps/ 2 sets     Pelvic foor relaxation stretches on ground:  (every morning and while son plays on floor)   5 breaths each   1) "Tai Kwan do" stretch ( knee bent, heel to groin, side bend to extended knee     2) butterfly: feet together    3) childs pose rocking

## 2014-10-11 NOTE — Therapy (Addendum)
[facility] MAIN [facility] SERVICES [address] [place], [place], [address] Phone: [phone]   Fax:  [phone]  Physical Therapy Treatment  Patient Details  Name: Alexis Petersen MRN: [id number] Date of Birth: [birth year] Referring Provider:  Ulyses Amor, CNM  Encounter Date: 10/11/2014      PT End of Session - 10/11/14 1401    Visit Number 4   Number of Visits 12   Date for PT Re-Evaluation 11/27/14   PT Start Time 1310   PT Stop Time 1405   PT Time Calculation (min) 55 min   Activity Tolerance Patient tolerated treatment well;No increased pain   Behavior During Therapy [facility] for tasks assessed/performed      Past Medical History  Diagnosis Date  . Anxiety and depression   . Anxiety   . Depression     Past Surgical History  Procedure Laterality Date  . Wisdom tooth extraction  2014  . Cesarean section  2016    There were no vitals filed for this visit.  Visit Diagnosis:  Fascial defect  Lack of coordination      Subjective Assessment - 10/11/14 1315    Subjective Pt reports being able to wear tighter fitting jeans the past two weeks. Pt only feels a "poke" sensation inear incision site "every once in a while".  Pt has been doing her exercises. Pt reports sexual intercourse has decreased in pain by 90%.  The remaining pain of 10% is related to feeling crampy and tight post-coitus. Pt has been walking with son but w/ stroller and path involves incline.Pt declined internal pelvic floor work 2/2 yeast infection.    Pertinent History Hx of sciatic pain during pregnancy but not radiating pain currently. Hx of fall on tailbone multiple times. Fx L ankle 6-7 yrs ago from jumping of a curb (immobilized 5-6 weeks and healed naturally).    Patient Stated Goals 1) get to wearing britches again 2) less pain with intercourse 3) be stronger   Currently in Pain? No/denies   Pain Location Abdomen            [facility] PT Assessment -  10/11/14 1347    Posture/Postural Control   Posture Comments less cuing for upright posture, less forward head and slumping   Palpation   Palpation comment decreased mm tensions along thoracic region                     [facility] Adult PT Treatment/Exercise - 10/11/14 1347    Ambulation/Gait   Gait Comments pushing stroller with shoulders squeezed back and tilting body when going up hills/ down hills    simulated stroller, body mechanics   Therapeutic Activites    Other Therapeutic Activities cable column w/ 20 3, walking forward.backward 5 ft each,, 10 reps   simulated for uphill/downhill stroller pushing   Neuro Re-ed    Neuro Re-ed Details  see HEP under pt instructions    Exercises   Other Exercises  --                PT Education - 10/11/14 1400    Education provided Yes   Education Details HEP   Person(s) Educated Patient   Methods Explanation;Demonstration;Verbal cues;Handout;Tactile cues   Comprehension Verbalized understanding;Returned demonstration             PT Long Term Goals - 10/11/14 1319    PT LONG TERM GOAL #1   Title Pt will increase her score  on PSFS: dressing 4/10 to > 8/10, bend/lift 3/10 > 6/10 , shaving 7/10 > 10/10, sex 8/10 > 10/10 in order to return to ADLs. (09/26/14: dressing 9/10, bend/lifting 9/10, shaving 9/10. 9/10)    Time 12   Period Weeks   Status Achieved   PT LONG TERM GOAL #2   Title Pt will demo no flinching w/ palpation to R lateral side of abdominal scar in order to tolerate tighter clothes wearing.    Time 12   Period Weeks   Status Achieved   PT LONG TERM GOAL #3   Title Pt will demo proper deep core coordination with lifting 30# 5 reps using proper body mechanics to demo ability to lift baby and car seat with less risk for injuries.   Time 12   Period Weeks   Status On-going   PT LONG TERM GOAL #4   Title Pt will report decrease pain with sexual intercourse 50% of the time in order to regain QOL.    Time  12   Period Weeks   Status Achieved   PT LONG TERM GOAL #5   Title Pt will demo fascial tensigrity with more caudal/ventral  positioning of bladder and a circumferential contraction without hips elevated on pillow in order to resolve sensation of having to urinate during intercourse.     Time 12   Period Weeks   Status New               Plan - 10/11/14 1401    Clinical Impression Statement Pt has progressed to strengthening HEP to be performed with son in order to promote compliance and consistency. Initiated deep core system activation with cable column exercise (20 lb)  to simulate task of pushing stroller up/downhill. Pt required neuro-re-edu for feet placement for increased balance. Pt is progressing well towards her remaining goals. Pt continues to come to PT every other week due to pt's busy schedule.      Pt will benefit from skilled therapeutic intervention in order to improve on the following deficits Abnormal gait;Decreased activity tolerance;Decreased strength;Decreased mobility;Hypomobility;Decreased scar mobility;Improper body mechanics;Increased fascial restricitons;Decreased range of motion;Decreased coordination;Decreased endurance;Decreased safety awareness;Increased muscle spasms;Pain;Decreased balance;Postural dysfunction;Impaired flexibility   Rehab Potential Good   PT Duration 12 weeks   PT Treatment/Interventions ADLs/Self Care Home Management;Aquatic Therapy;Biofeedback;Cryotherapy;Moist Heat;Stair training;Gait training;Functional mobility training;Patient/family education;Scar mobilization;Neuromuscular re-education;Balance training;Manual techniques;Therapeutic exercise;Therapeutic activities;Dry needling;Energy conservation   PT Next Visit Plan next visit in 2 weeks. initate dynamic stabilization, reassess pelvic floor    Consulted and Agree with Plan of Care Patient        Problem List Patient Active Problem List   Diagnosis Date Noted  . Well woman exam  with routine gynecological exam 08/22/2014  . Fatigue 08/22/2014  . Incisional pain 08/22/2014  . Anxiety and depression 05/22/2014    Mariane Masters  ,PT, DPT, E-RYT  10/11/2014, 2:14 PM  [place] MAIN [facility] SERVICES [address] [place], [place], [address] Phone: [phone]   Fax:  [phone]

## 2014-10-17 ENCOUNTER — Ambulatory Visit: Payer: 59 | Admitting: Physical Therapy

## 2014-10-24 ENCOUNTER — Ambulatory Visit: Payer: 59 | Admitting: Physical Therapy

## 2014-10-24 DIAGNOSIS — R279 Unspecified lack of coordination: Secondary | ICD-10-CM

## 2014-10-24 DIAGNOSIS — M629 Disorder of muscle, unspecified: Secondary | ICD-10-CM

## 2014-10-24 NOTE — Patient Instructions (Addendum)
Strengthening exercises as play with son:   Actuary  (opp leg/ arm) toes tucked under knee under hips)  Keep pelvis levelled , don't rock it when lifting leg   10 reps/ day           childs poses stretch      ____________________________________ Inquired about her intake of sugar and breads which she stated she eats large amounts of it. PT provided web resources on yeast infections and association to food types (sugar, breads).

## 2014-10-25 NOTE — Therapy (Signed)
[place] MAIN [facility] SERVICES [address] [place], [place], [address] Phone: [phone]   Fax:  [phone]  Physical Therapy Treatment / Discharge Summary  Patient Details  Name: Alexis Petersen MRN: [id number] Date of Birth: 25-Aug-1985 Referring Mickenzie Stolar:  Evonnie Pat, CNM  Encounter Date: 10/24/2014      PT End of Session - 10/25/14 1811    Visit Number 5   Number of Visits 12   PT Start Time 0737   PT Stop Time 1062   PT Time Calculation (min) 58 min   Activity Tolerance Patient tolerated treatment well;No increased pain   Behavior During Therapy [facility] for tasks assessed/performed      Past Medical History  Diagnosis Date  . Anxiety and depression   . Anxiety   . Depression     Past Surgical History  Procedure Laterality Date  . Wisdom tooth extraction  2014  . Cesarean section  2016    There were no vitals filed for this visit.  Visit Diagnosis:  Fascial defect  Lack of coordination      Subjective Assessment - 10/24/14 1310    Subjective Pt stated she was able to continue with HEP. Pt states "everything is starting to get better". Pt tried sexual intercourse and had no pain. Pt has had a hx of yeast infections when using lubricants, condoms, certain soaps, and body wash. Pt is not using the lubricant sample PT provided because pt had a reaction to it as well. Pt states that she no longer feels she has to urinate during intercourse and the pressure feeling has decreased. Pt has returned to walking.     Pertinent History Hx of sciatic pain during pregnancy but not radiating pain currently. Hx of fall on tailbone multiple times. Fx L ankle 6-7 yrs ago from jumping of a curb (immobilized 5-6 weeks and healed naturally).    Patient Stated Goals 1) get to wearing britches again 2) less pain with intercourse 3) be stronger                      Pelvic Floor Special Questions - 10/25/14 1126    External  Perineal Exam pt consented verbally and stated no contraindications (withheld internal exam due to pt reporting yeast infection)    External Palpation examined for prolapse by widening labia majora   Prolapse --  no abnormal position when cued to cough.            [place] Adult PT Treatment/Exercise - 10/25/14 1121    Therapeutic Activites    Lifting 20 lb, 25lb, 29 lb, box 3 reps each and carrying it to place on high shelf with proper form and technique.  cues for scapular retractionand exhalation, pt demo'd correctly   Neuro Re-ed    Neuro Re-ed Details  birddog alignment and technique 10 reps bilaterally, cues for pelvic stability., crabcrawling and childs pose rocking   childs pose stretch                PT Education - 10/25/14 1813    Education provided Yes   Education Details HEP, D/C, education on yeast infection and association with food choices   Person(s) Educated Patient   Methods Explanation;Demonstration;Tactile cues;Verbal cues;Handout   Comprehension Verbalized understanding;Returned demonstration             PT Long Term Goals - 10/24/14 1314    PT LONG TERM GOAL #1   Title Pt  will increase her score on PSFS: dressing 4/10 to > 8/10, bend/lift 3/10 > 6/10 , shaving 7/10 > 10/10, sex 8/10 > 10/10 in order to return to ADLs. (09/26/14: dressing 9/10, bend/lifting 9/10, shaving 9/10. 9/10)    Time 12   Period Weeks   Status Achieved   PT LONG TERM GOAL #2   Title Pt will demo no flinching w/ palpation to R lateral side of abdominal scar in order to tolerate tighter clothes wearing.    Time 12   Period Weeks   Status Achieved   PT LONG TERM GOAL #3   Title Pt will demo proper deep core coordination with lifting 30# 5 reps using proper body mechanics to demo ability to lift baby and car seat with less risk for injuries.   Time 12   Period Weeks   Status Achieved   PT LONG TERM GOAL #4   Title Pt will report decrease pain with sexual intercourse 50% of  the time in order to regain QOL.    Time 12   Period Weeks   Status Achieved   PT LONG TERM GOAL #5   Title Pt will demo fascial tensigrity with more caudal/ventral  positioning of bladder and a circumferential contraction without hips elevated on pillow in order to resolve sensation of having to urinate during intercourse.     Time 12   Period Weeks   Status Achieved               Plan - 10/25/14 1814    Clinical Impression Statement Pt has met 100% of her goals and is ready for discharge. Pt has demo'd improved fascial mobility around C-section scar, increased deep core strength, and more cephaled position of bladder. Pt has returned to being able to tolerate  wearing tighter jeans, picking up her son, and having intercourse with her husband without pain. Pt's report of frequent yeast infections was followed-up with questions that revealed pt consumed lots of sugary foods in her daily diet (i.e. breads, sweet tea, and candy). PT provided web resources on changing diets to minimize yeast infections by decreasing foods that contain yeast/sugar. Pt was a pleasure to work with and had remained compliant despite busy schedule. Pt reported she has improved a "[employer] Better" based oon the GROC.    Pt will benefit from skilled therapeutic intervention in order to improve on the following deficits Abnormal gait;Decreased activity tolerance;Decreased strength;Decreased mobility;Hypomobility;Decreased scar mobility;Improper body mechanics;Increased fascial restricitons;Decreased range of motion;Decreased coordination;Decreased endurance;Decreased safety awareness;Increased muscle spasms;Pain;Decreased balance;Postural dysfunction;Impaired flexibility   Rehab Potential Good   PT Duration 12 weeks   PT Treatment/Interventions ADLs/Self Care Home Management;Aquatic Therapy;Biofeedback;Cryotherapy;Moist Heat;Stair training;Gait training;Functional mobility training;Patient/family education;Scar  mobilization;Neuromuscular re-education;Balance training;Manual techniques;Therapeutic exercise;Therapeutic activities;Dry needling;Energy conservation   Consulted and Agree with Plan of Care Patient        Problem List Patient Active Problem List   Diagnosis Date Noted  . Well woman exam with routine gynecological exam 08/22/2014  . Fatigue 08/22/2014  . Incisional pain 08/22/2014  . Anxiety and depression 05/22/2014    Jerl Mina ,PT, DPT, E-RYT  10/25/2014, 6:17 PM  [place] MAIN [facility] SERVICES [address] [place], [place], [address] Phone: [phone]   Fax:  [phone]

## 2014-12-25 ENCOUNTER — Ambulatory Visit (INDEPENDENT_AMBULATORY_CARE_PROVIDER_SITE_OTHER): Payer: PRIVATE HEALTH INSURANCE | Admitting: Obstetrics and Gynecology

## 2014-12-25 VITALS — BP 104/68 | HR 88 | Wt 156.2 lb

## 2014-12-25 DIAGNOSIS — Z3040 Encounter for surveillance of contraceptives, unspecified: Secondary | ICD-10-CM

## 2014-12-25 MED ORDER — MEDROXYPROGESTERONE ACETATE 150 MG/ML IM SUSP
150.0000 mg | Freq: Once | INTRAMUSCULAR | Status: AC
  Administered 2014-12-25: 150 mg via INTRAMUSCULAR

## 2014-12-25 NOTE — Progress Notes (Cosign Needed)
Pt is here for her depo provera inj Denies any s/e

## 2015-03-12 ENCOUNTER — Telehealth: Payer: Self-pay

## 2015-03-12 ENCOUNTER — Ambulatory Visit: Payer: PRIVATE HEALTH INSURANCE

## 2015-03-12 NOTE — Telephone Encounter (Signed)
Pt came for depo-provera injection but did not bring medication because she would like to change her birth control. I informed pt that she may need an appt for this. Was going to send [facility] message but provider is here. Pt. Will need an appt.

## 2015-03-12 NOTE — Telephone Encounter (Signed)
Pt calls office and an appt made for 03/13/2015 at 10:00am with [facility].

## 2015-03-13 ENCOUNTER — Ambulatory Visit (INDEPENDENT_AMBULATORY_CARE_PROVIDER_SITE_OTHER): Payer: PRIVATE HEALTH INSURANCE | Admitting: Obstetrics and Gynecology

## 2015-03-13 ENCOUNTER — Encounter: Payer: Self-pay | Admitting: Obstetrics and Gynecology

## 2015-03-13 VITALS — BP 114/78 | HR 75 | Ht 64.0 in | Wt 155.1 lb

## 2015-03-13 DIAGNOSIS — Z3040 Encounter for surveillance of contraceptives, unspecified: Secondary | ICD-10-CM | POA: Diagnosis not present

## 2015-03-13 MED ORDER — NORETHIN ACE-ETH ESTRAD-FE 1-20 MG-MCG PO TABS: 1.0000 | ORAL_TABLET | Freq: Every day | ORAL | Status: DC

## 2015-03-13 MED ORDER — FLUOXETINE HCL 20 MG PO TABS: 20.0000 mg | ORAL_TABLET | Freq: Every day | ORAL | Status: DC

## 2015-03-13 MED ORDER — ALPRAZOLAM 0.5 MG PO TABS: 0.5000 mg | ORAL_TABLET | Freq: Three times a day (TID) | ORAL | Status: DC | PRN

## 2015-03-13 NOTE — Progress Notes (Signed)
Patient ID: Alexis Petersen, female   DOB: 28-Apr-1985, 30 y.o.   MRN: [id number]  Here to discuss birth control as she desires stopping Depo. Just worried about side-effects, although she feels fine. Does need refills on prozac and xanax, and felt like prozac helped a little.  Blood pressure 114/78, pulse 75, height  (1.626 m), weight 155 lb 1.6 oz (70.353 kg), not currently breastfeeding.  A: depression & anxiety BC counseling  P: refilled xanax, and increased prozac to  daily Switched to [facility] Junel 1/20- rx sent in to start tomorrow morning. RTC prn   Melody Seaview, CNM

## 2015-04-23 ENCOUNTER — Encounter: Payer: Self-pay | Admitting: Family Medicine

## 2015-04-23 ENCOUNTER — Ambulatory Visit (INDEPENDENT_AMBULATORY_CARE_PROVIDER_SITE_OTHER): Payer: 59 | Admitting: Family Medicine

## 2015-04-23 VITALS — BP 94/62 | HR 75 | Temp 99.1°F | Ht 64.0 in | Wt 154.8 lb

## 2015-04-23 DIAGNOSIS — Z8632 Personal history of gestational diabetes: Secondary | ICD-10-CM | POA: Diagnosis not present

## 2015-04-23 DIAGNOSIS — G43009 Migraine without aura, not intractable, without status migrainosus: Secondary | ICD-10-CM

## 2015-04-23 MED ORDER — ZOLMITRIPTAN 5 MG PO TABS: 2.5000 mg | ORAL_TABLET | Freq: Every day | ORAL | Status: DC | PRN

## 2015-04-23 NOTE — Patient Instructions (Signed)
Restart prozac.  Use the zomig when needed for the worst headaches.  Try to stop smoking when back on the prozac.  Take care.  Glad to see you.

## 2015-04-23 NOTE — Progress Notes (Signed)
Pre visit review using our clinic review tool, if applicable. No additional management support is needed unless otherwise documented below in the visit note.  H/o anxiety, about to start back on SSRI per outside MD.  No SI/HI, okay for outpatient f/u. Rare BZD use.    H/o GDM noted.   Migraines.  Photo and phonophobia.  Has vomited from patient prev.  FH noted, mother and father.  Longstanding.  No aura.  Prev used zomig with some relief.  Not on proph med at this point.  Sx most days of the week, some days worse than others.    She has some occ R wrist pain.  No trauma.  No swelling.  Dorsal pain.  Some days better than others.    PMH and SH reviewed  ROS: See HPI, otherwise noncontributory.  Meds, vitals, and allergies reviewed.   GEN: nad, alert and oriented HEENT: mucous membranes moist NECK: supple w/o LA CV: rrr.  PULM: ctab, no inc wob ABD: soft, +bs EXT: no edema SKIN: no acute rash CN 2-12 wnl B, S/S/DTR wnl x4 R wrist with normal inspection, ROM and palpation.  Not puffy or red or bruised.   Benign exam , d/w pt.

## 2015-04-25 ENCOUNTER — Encounter: Payer: Self-pay | Admitting: Family Medicine

## 2015-04-25 DIAGNOSIS — Z8632 Personal history of gestational diabetes: Secondary | ICD-10-CM | POA: Insufficient documentation

## 2015-04-25 DIAGNOSIS — G43009 Migraine without aura, not intractable, without status migrainosus: Secondary | ICD-10-CM | POA: Insufficient documentation

## 2015-04-25 LAB — HIV ANTIBODY (ROUTINE TESTING W REFLEX): HIV: NEGATIVE

## 2015-04-25 NOTE — Assessment & Plan Note (Signed)
Path phys d/w pt.  Restart prozac as this may help with GAD and migraine frequency. Use the zomig when needed for the worst headaches but try to limit use.   Try to stop smoking when back on the prozac.  She agrees.  Will update me as needed.  >30 minutes spent in face to face time with patient, >50% spent in counselling or coordination of care

## 2015-06-07 ENCOUNTER — Ambulatory Visit (INDEPENDENT_AMBULATORY_CARE_PROVIDER_SITE_OTHER): Payer: 59 | Admitting: Family Medicine

## 2015-06-07 ENCOUNTER — Encounter: Payer: Self-pay | Admitting: Family Medicine

## 2015-06-07 VITALS — BP 120/82 | HR 79 | Temp 98.3°F | Ht 64.0 in | Wt 150.0 lb

## 2015-06-07 DIAGNOSIS — J329 Chronic sinusitis, unspecified: Secondary | ICD-10-CM

## 2015-06-07 DIAGNOSIS — H6983 Other specified disorders of Eustachian tube, bilateral: Secondary | ICD-10-CM

## 2015-06-07 MED ORDER — BENZONATATE 100 MG PO CAPS: 100.0000 mg | ORAL_CAPSULE | Freq: Three times a day (TID) | ORAL | Status: DC | PRN

## 2015-06-07 MED ORDER — FLUTICASONE PROPIONATE 50 MCG/ACT NA SUSP: 2.0000 | Freq: Every day | NASAL | Status: DC

## 2015-06-07 NOTE — Progress Notes (Signed)
Pre visit review using our clinic review tool, if applicable. No additional management support is needed unless otherwise documented below in the visit note. 

## 2015-06-07 NOTE — Progress Notes (Signed)
HPI:  Nasal congestion/ear fullness: -started: 4-5 days ago -symptoms:nasal congestion, sore throat, cough, tickle in throat, ears feel full and pop sometimes, sometimes feels hearing is mildly decreased -denies:fever, SOB, NVD, tooth pain, sinus pain, ear pain, drainage from ear -has tried: nothing -sick contacts/travel/risks: no reported flu, strep or tick exposure - but 89 mo old son with "sinus infection" right now -Hx of: recurrent sinus issues and ear infections [facility] times with abx  ROS: See pertinent positives and negatives per HPI.  Past Medical History  Diagnosis Date  . Anxiety and depression   . Anxiety   . Depression   . Migraine without aura   . Gestational diabetes     Past Surgical History  Procedure Laterality Date  . Wisdom tooth extraction  2014  . Cesarean section  2016    Family History  Problem Relation Age of Onset  . Hypertension Father   . Stroke Father     Social History   Social History  . Marital Status: Single    Spouse Name: N/A  . Number of Children: N/A  . Years of Education: N/A   Social History Main Topics  . Smoking status: Current Every Day Smoker -- 0.50 packs/day for 12 years    Types: Cigarettes  . Smokeless tobacco: Never Used  . Alcohol Use: No  . Drug Use: No  . Sexual Activity: Yes    Birth Control/ Protection: Injection     Comment: depo provera   Other Topics Concern  . None   Social History Narrative   Married 2015   1 child   Artist- likes to draw     Current outpatient prescriptions:  .  ALPRAZolam (XANAX) 0.5 MG tablet, Take 1 tablet (0.5 mg total) by mouth 3 (three) times daily as needed for anxiety., Disp: 30 tablet, Rfl: 2 .  FLUoxetine (PROZAC) 20 MG tablet, Take 1 tablet (20 mg total) by mouth daily., Disp: 30 tablet, Rfl: 6 .  norethindrone-ethinyl estradiol (JUNEL FE 1/20) 1-20 MG-MCG tablet, Take 1 tablet by mouth daily., Disp: 1 Package, Rfl: 11 .   zolmitriptan (ZOMIG) 5 MG tablet, Take 0.5-1 tablets (2.5-5 mg total) by mouth daily as needed for migraine (try to limit use as much as possible)., Disp: 10 tablet, Rfl: 1 .  benzonatate (TESSALON PERLES) 100 MG capsule, Take 1 capsule (100 mg total) by mouth 3 (three) times daily as needed., Disp: 20 capsule, Rfl: 0 .  fluticasone (FLONASE) 50 MCG/ACT nasal spray, Place 2 sprays into both nostrils daily., Disp: 16 g, Rfl: 6  EXAM:  Filed Vitals:   06/07/15 1252  BP: 120/82  Pulse: 79  Temp: 98.3 F (36.8 C)    Body mass index is 25.73 kg/(m^2).  GENERAL: vitals reviewed and listed above, alert, oriented, appears well hydrated and in no acute distress  HEENT: atraumatic, conjunttiva clear, no obvious abnormalities on inspection of external nose and ears, normal appearance of ear canals and TMs, clear nasal congestion, boggy turbinates, mild post oropharyngeal erythema with PND, no tonsillar edema or exudate, no sinus TTP  NECK: no obvious masses on inspection  LUNGS: clear to auscultation bilaterally, no wheezes, rales or rhonchi, good air movement  CV: HRRR, no peripheral edema  MS: moves all extremities without noticeable abnormality  PSYCH: pleasant and cooperative, no obvious depression or anxiety  ASSESSMENT AND PLAN:  Discussed the following assessment and plan:  Rhinosinusitis  Eustachian tube dysfunction, bilateral  -given HPI  and exam findings today, a serious infection or illness is unlikely. We discussed potential etiologies, with VURI being most likely with underlying allergic rhinitis and secondary eustachian tube dysfunction. We discussed treatment side effects, likely course, antibiotic misuse, transmission, and signs of developing a serious illness. Advised short course nasal decongestant, INS and antihistamine with follow up in 2-3 weeks. -of course, we advised to return or notify a doctor immediately if symptoms worsen or persist or new concerns  arise.    Patient Instructions  Afrin nasal spray twice daily for 4 days. Do not use longer than 4 days. This is available for purchase over-the-counter.  Start Claritin once daily. This is available over-the-counter.  Start Flonase 2 sprays each nostril daily for 1 month, then 1 spray each nostril daily.  Tessalon perles if needed per instructions for cough.  Schedule follow-up with your doctor in 2-4 weeks. Follow-up sooner if you have worsening symptoms, sinus pain, fevers or other concerns.     Kriste BasqueKIM, HANNAH R.

## 2015-06-07 NOTE — Patient Instructions (Signed)
Afrin nasal spray twice daily for 4 days. Do not use longer than 4 days. This is available for purchase over-the-counter.  Start Claritin once daily. This is available over-the-counter.  Start Flonase 2 sprays each nostril daily for 1 month, then 1 spray each nostril daily.  Tessalon perles if needed per instructions for cough.  Schedule follow-up with your doctor in 2-4 weeks. Follow-up sooner if you have worsening symptoms, sinus pain, fevers or other concerns.

## 2015-08-27 ENCOUNTER — Encounter: Payer: PRIVATE HEALTH INSURANCE | Admitting: Obstetrics and Gynecology

## 2015-08-27 ENCOUNTER — Encounter: Payer: Self-pay | Admitting: Obstetrics and Gynecology

## 2015-08-27 ENCOUNTER — Ambulatory Visit (INDEPENDENT_AMBULATORY_CARE_PROVIDER_SITE_OTHER): Payer: PRIVATE HEALTH INSURANCE | Admitting: Obstetrics and Gynecology

## 2015-08-27 VITALS — BP 114/72 | HR 63 | Ht 64.0 in | Wt 143.6 lb

## 2015-08-27 DIAGNOSIS — Z01419 Encounter for gynecological examination (general) (routine) without abnormal findings: Secondary | ICD-10-CM

## 2015-08-27 DIAGNOSIS — N912 Amenorrhea, unspecified: Secondary | ICD-10-CM

## 2015-08-27 LAB — POCT URINE PREGNANCY: PREG TEST UR: NEGATIVE

## 2015-08-27 NOTE — Progress Notes (Signed)
Subjective:   Alexis Petersen is a 30 y.o. G50P1001 Caucasian female here for a routine well-woman exam.  No LMP recorded. Patient is not currently having periods (Reason: Other).    Current complaints: no menses since stopping Depo in Feb 2017, negative home UPTs; also multiple new moles that are tender to touch- on chest and feet PCP: Para March       does desire labs  Social History: Sexual: heterosexual Marital Status: married Living situation: with family Occupation: homemaker Tobacco/alcohol: no alcohol use Illicit drugs: no history of illicit drug use  The following portions of the patient's history were reviewed and updated as appropriate: allergies, current medications, past family history, past medical history, past social history, past surgical history and problem list.  Past Medical History Past Medical History:  Diagnosis Date  . Anxiety   . Anxiety and depression   . Depression   . Gestational diabetes   . Migraine without aura     Past Surgical History Past Surgical History:  Procedure Laterality Date  . CESAREAN SECTION  2016  . WISDOM TOOTH EXTRACTION  2014    Gynecologic History G1P1001  No LMP recorded. Patient is not currently having periods (Reason: Other). Contraception: condoms Last Pap: 2016. Results were: normal   Obstetric History OB History  Gravida Para Term Preterm AB Living  SAB TAB Ectopic Multiple Live Births          1    # Outcome Date GA Lbr Len/2nd Weight Sex Delivery Anes PTL Lv  1 Term 03/07/14   5 lb 13 oz (2.637 kg) M CS-LTranv   LIV      Current Medications Current Outpatient Prescriptions on File Prior to Visit  Medication Sig Dispense Refill  . ALPRAZolam (XANAX) 0.5 MG tablet Take 1 tablet (0.5 mg total) by mouth 3 (three) times daily as needed for anxiety. 30 tablet 2  . FLUoxetine (PROZAC) 20 MG tablet Take 1 tablet (20 mg total) by mouth daily. 30 tablet 6  . fluticasone (FLONASE) 50 MCG/ACT nasal spray  Place 2 sprays into both nostrils daily. 16 g 6  . zolmitriptan (ZOMIG) 5 MG tablet Take 0.5-1 tablets (2.5-5 mg total) by mouth daily as needed for migraine (try to limit use as much as possible). 10 tablet 1  . norethindrone-ethinyl estradiol (JUNEL FE 1/20) 1-20 MG-MCG tablet Take 1 tablet by mouth daily. (Patient not taking: Reported on 08/27/2015) 1 Package 11   No current facility-administered medications on file prior to visit.     Review of Systems Patient denies any headaches, blurred vision, shortness of breath, chest pain, abdominal pain, problems with bowel movements, urination, or intercourse.  Objective:  BP 114/72   Pulse 63   Ht  (1.626 m)   Wt 143 lb 9.6 oz (65.1 kg)   BMI 24.65 kg/m  Physical Exam  General:  Well developed, well nourished, no acute distress. She is alert and oriented x3. Skin:  Warm and dry- >35 nevi- different sizes- all look benign to me Neck:  Midline trachea, no thyromegaly or nodules Cardiovascular: Regular rate and rhythm, no murmur heard Lungs:  Effort normal, all lung fields clear to auscultation bilaterally Breasts:  No dominant palpable mass, retraction, or nipple discharge Abdomen:  Soft, non tender, no hepatosplenomegaly or masses Pelvic:  External genitalia is normal in appearance.  The vagina is normal in appearance. The cervix is bulbous, no CMT.  Thin prep pap is  not done . Uterus is felt to be normal size, shape, and contour.  No adnexal masses or tenderness noted.  Extremities:  No swelling or varicosities noted Psych:  She has a normal mood and affect  Assessment:   Healthy well-woman exam Secondary amenorrhea  Plan:  Labs obtained Referral to Dermatology to establish care  to go ahead and start OCPs if labs negative F/U 1 year for AE, or sooner if needed  Melody Suzan Nailer, CNM

## 2015-08-27 NOTE — Patient Instructions (Signed)
Preventive Care for Adults, Female A healthy lifestyle and preventive care can promote health and wellness. Preventive health guidelines for women include the following key practices.  A routine yearly physical is a good way to check with your health care provider about your health and preventive screening. It is a chance to share any concerns and updates on your health and to receive a thorough exam.  Visit your dentist for a routine exam and preventive care every 6 months. Brush your teeth twice a day and floss once a day. Good oral hygiene prevents tooth decay and gum disease.  The frequency of eye exams is based on your age, health, family medical history, use of contact lenses, and other factors. Follow your health care provider's recommendations for frequency of eye exams.  Eat a healthy diet. Foods like vegetables, fruits, whole grains, low-fat dairy products, and lean protein foods contain the nutrients you need without too many calories. Decrease your intake of foods high in solid fats, added sugars, and salt. Eat the right amount of calories for you.Get information about a proper diet from your health care provider, if necessary.  Regular physical exercise is one of the most important things you can do for your health. Most adults should get at least 150 minutes of moderate-intensity exercise (any activity that increases your heart rate and causes you to sweat) each week. In addition, most adults need muscle-strengthening exercises on 2 or more days a week.  Maintain a healthy weight. The body mass index (BMI) is a screening tool to identify possible weight problems. It provides an estimate of body fat based on height and weight. Your health care provider can find your BMI and can help you achieve or maintain a healthy weight.For adults 20 years and older:  A BMI below 18.5 is considered underweight.  A BMI of 18.5 to 24.9 is normal.  A BMI of 25 to 29.9 is considered  overweight.  A BMI of 30 and above is considered obese.  Maintain normal blood lipids and cholesterol levels by exercising and minimizing your intake of saturated fat. Eat a balanced diet with plenty of fruit and vegetables. Blood tests for lipids and cholesterol should begin at age 64 and be repeated every 5 years. If your lipid or cholesterol levels are high, you are over 50, or you are at high risk for heart disease, you may need your cholesterol levels checked more frequently.Ongoing high lipid and cholesterol levels should be treated with medicines if diet and exercise are not working.  If you smoke, find out from your health care provider how to quit. If you do not use tobacco, do not start.  Lung cancer screening is recommended for adults aged 52-80 years who are at high risk for developing lung cancer because of a history of smoking. A yearly low-dose CT scan of the lungs is recommended for people who have at least a 30-pack-year history of smoking and are a current smoker or have quit within the past 15 years. A pack year of smoking is smoking an average of 1 pack of cigarettes a day for 1 year (for example: 1 pack a day for 30 years or 2 packs a day for 15 years). Yearly screening should continue until the smoker has stopped smoking for at least 15 years. Yearly screening should be stopped for people who develop a health problem that would prevent them from having lung cancer treatment.  If you are pregnant, do not drink alcohol. If you are  breastfeeding, be very cautious about drinking alcohol. If you are not pregnant and choose to drink alcohol, do not have more than 1 drink per day. One drink is considered to be 12 ounces (355 mL) of beer, 5 ounces (148 mL) of wine, or 1.5 ounces (44 mL) of liquor.  Avoid use of street drugs. Do not share needles with anyone. Ask for help if you need support or instructions about stopping the use of drugs.  High blood pressure causes heart disease and  increases the risk of stroke. Your blood pressure should be checked at least every 1 to 2 years. Ongoing high blood pressure should be treated with medicines if weight loss and exercise do not work.  If you are 25-78 years old, ask your health care provider if you should take aspirin to prevent strokes.  Diabetes screening is done by taking a blood sample to check your blood glucose level after you have not eaten for a certain period of time (fasting). If you are not overweight and you do not have risk factors for diabetes, you should be screened once every 3 years starting at age 86. If you are overweight or obese and you are 3-87 years of age, you should be screened for diabetes every year as part of your cardiovascular risk assessment.  Breast cancer screening is essential preventive care for women. You should practice "breast self-awareness." This means understanding the normal appearance and feel of your breasts and may include breast self-examination. Any changes detected, no matter how small, should be reported to a health care provider. Women in their 66s and 30s should have a clinical breast exam (CBE) by a health care provider as part of a regular health exam every 1 to 3 years. After age 43, women should have a CBE every year. Starting at age 37, women should consider having a mammogram (breast X-ray test) every year. Women who have a family history of breast cancer should talk to their health care provider about genetic screening. Women at a high risk of breast cancer should talk to their health care providers about having an MRI and a mammogram every year.  Breast cancer gene (BRCA)-related cancer risk assessment is recommended for women who have family members with BRCA-related cancers. BRCA-related cancers include breast, ovarian, tubal, and peritoneal cancers. Having family members with these cancers may be associated with an increased risk for harmful changes (mutations) in the breast  cancer genes BRCA1 and BRCA2. Results of the assessment will determine the need for genetic counseling and BRCA1 and BRCA2 testing.  Your health care provider may recommend that you be screened regularly for cancer of the pelvic organs (ovaries, uterus, and vagina). This screening involves a pelvic examination, including checking for microscopic changes to the surface of your cervix (Pap test). You may be encouraged to have this screening done every 3 years, beginning at age 78.  For women ages 79-65, health care providers may recommend pelvic exams and Pap testing every 3 years, or they may recommend the Pap and pelvic exam, combined with testing for human papilloma virus (HPV), every 5 years. Some types of HPV increase your risk of cervical cancer. Testing for HPV may also be done on women of any age with unclear Pap test results.  Other health care providers may not recommend any screening for nonpregnant women who are considered low risk for pelvic cancer and who do not have symptoms. Ask your health care provider if a screening pelvic exam is right for  you.  If you have had past treatment for cervical cancer or a condition that could lead to cancer, you need Pap tests and screening for cancer for at least 20 years after your treatment. If Pap tests have been discontinued, your risk factors (such as having a new sexual partner) need to be reassessed to determine if screening should resume. Some women have medical problems that increase the chance of getting cervical cancer. In these cases, your health care provider may recommend more frequent screening and Pap tests.  Colorectal cancer can be detected and often prevented. Most routine colorectal cancer screening begins at the age of 50 years and continues through age 75 years. However, your health care provider may recommend screening at an earlier age if you have risk factors for colon cancer. On a yearly basis, your health care provider may provide  home test kits to check for hidden blood in the stool. Use of a small camera at the end of a tube, to directly examine the colon (sigmoidoscopy or colonoscopy), can detect the earliest forms of colorectal cancer. Talk to your health care provider about this at age 50, when routine screening begins. Direct exam of the colon should be repeated every 5-10 years through age 75 years, unless early forms of precancerous polyps or small growths are found.  People who are at an increased risk for hepatitis B should be screened for this virus. You are considered at high risk for hepatitis B if:  You were born in a country where hepatitis B occurs often. Talk with your health care provider about which countries are considered high risk.  Your parents were born in a high-risk country and you have not received a shot to protect against hepatitis B (hepatitis B vaccine).  You have HIV or AIDS.  You use needles to inject street drugs.  You live with, or have sex with, someone who has hepatitis B.  You get hemodialysis treatment.  You take certain medicines for conditions like cancer, organ transplantation, and autoimmune conditions.  Hepatitis C blood testing is recommended for all people born from 1945 through 1965 and any individual with known risks for hepatitis C.  Practice safe sex. Use condoms and avoid high-risk sexual practices to reduce the spread of sexually transmitted infections (STIs). STIs include gonorrhea, chlamydia, syphilis, trichomonas, herpes, HPV, and human immunodeficiency virus (HIV). Herpes, HIV, and HPV are viral illnesses that have no cure. They can result in disability, cancer, and death.  You should be screened for sexually transmitted illnesses (STIs) including gonorrhea and chlamydia if:  You are sexually active and are younger than 24 years.  You are older than 24 years and your health care provider tells you that you are at risk for this type of infection.  Your sexual  activity has changed since you were last screened and you are at an increased risk for chlamydia or gonorrhea. Ask your health care provider if you are at risk.  If you are at risk of being infected with HIV, it is recommended that you take a prescription medicine daily to prevent HIV infection. This is called preexposure prophylaxis (PrEP). You are considered at risk if:  You are sexually active and do not regularly use condoms or know the HIV status of your partner(s).  You take drugs by injection.  You are sexually active with a partner who has HIV.  Talk with your health care provider about whether you are at high risk of being infected with HIV. If   you choose to begin PrEP, you should first be tested for HIV. You should then be tested every 3 months for as long as you are taking PrEP.  Osteoporosis is a disease in which the bones lose minerals and strength with aging. This can result in serious bone fractures or breaks. The risk of osteoporosis can be identified using a bone density scan. Women ages 1 years and over and women at risk for fractures or osteoporosis should discuss screening with their health care providers. Ask your health care provider whether you should take a calcium supplement or vitamin D to reduce the rate of osteoporosis.  Menopause can be associated with physical symptoms and risks. Hormone replacement therapy is available to decrease symptoms and risks. You should talk to your health care provider about whether hormone replacement therapy is right for you.  Use sunscreen. Apply sunscreen liberally and repeatedly throughout the day. You should seek shade when your shadow is shorter than you. Protect yourself by wearing long sleeves, pants, a wide-brimmed hat, and sunglasses year round, whenever you are outdoors.  Once a month, do a whole body skin exam, using a mirror to look at the skin on your back. Tell your health care provider of new moles, moles that have irregular  borders, moles that are larger than a pencil eraser, or moles that have changed in shape or color.  Stay current with required vaccines (immunizations).  Influenza vaccine. All adults should be immunized every year.  Tetanus, diphtheria, and acellular pertussis (Td, Tdap) vaccine. Pregnant women should receive 1 dose of Tdap vaccine during each pregnancy. The dose should be obtained regardless of the length of time since the last dose. Immunization is preferred during the 27th-36th week of gestation. An adult who has not previously received Tdap or who does not know her vaccine status should receive 1 dose of Tdap. This initial dose should be followed by tetanus and diphtheria toxoids (Td) booster doses every 10 years. Adults with an unknown or incomplete history of completing a 3-dose immunization series with Td-containing vaccines should begin or complete a primary immunization series including a Tdap dose. Adults should receive a Td booster every 10 years.  Varicella vaccine. An adult without evidence of immunity to varicella should receive 2 doses or a second dose if she has previously received 1 dose. Pregnant females who do not have evidence of immunity should receive the first dose after pregnancy. This first dose should be obtained before leaving the health care facility. The second dose should be obtained 4-8 weeks after the first dose.  Human papillomavirus (HPV) vaccine. Females aged 13-26 years who have not received the vaccine previously should obtain the 3-dose series. The vaccine is not recommended for use in pregnant females. However, pregnancy testing is not needed before receiving a dose. If a female is found to be pregnant after receiving a dose, no treatment is needed. In that case, the remaining doses should be delayed until after the pregnancy. Immunization is recommended for any person with an immunocompromised condition through the age of 24 years if she did not get any or all doses  earlier. During the 3-dose series, the second dose should be obtained 4-8 weeks after the first dose. The third dose should be obtained 24 weeks after the first dose and 16 weeks after the second dose.  Zoster vaccine. One dose is recommended for adults aged 97 years or older unless certain conditions are present.  Measles, mumps, and rubella (MMR) vaccine. Adults born  before 1957 generally are considered immune to measles and mumps. Adults born in [birth year] or later should have 1 or more doses of MMR vaccine unless there is a contraindication to the vaccine or there is laboratory evidence of immunity to each of the three diseases. A routine second dose of MMR vaccine should be obtained at least 28 days after the first dose for students attending postsecondary schools, health care workers, or international travelers. People who received inactivated measles vaccine or an unknown type of measles vaccine during 1963-1967 should receive 2 doses of MMR vaccine. People who received inactivated mumps vaccine or an unknown type of mumps vaccine before 1979 and are at high risk for mumps infection should consider immunization with 2 doses of MMR vaccine. For females of childbearing age, rubella immunity should be determined. If there is no evidence of immunity, females who are not pregnant should be vaccinated. If there is no evidence of immunity, females who are pregnant should delay immunization until after pregnancy. Unvaccinated health care workers born before 60 who lack laboratory evidence of measles, mumps, or rubella immunity or laboratory confirmation of disease should consider measles and mumps immunization with 2 doses of MMR vaccine or rubella immunization with 1 dose of MMR vaccine.  Pneumococcal 13-valent conjugate (PCV13) vaccine. When indicated, a person who is uncertain of his immunization history and has no record of immunization should receive the PCV13 vaccine. All adults 61 years of age and older  should receive this vaccine. An adult aged 92 years or older who has certain medical conditions and has not been previously immunized should receive 1 dose of PCV13 vaccine. This PCV13 should be followed with a dose of pneumococcal polysaccharide (PPSV23) vaccine. Adults who are at high risk for pneumococcal disease should obtain the PPSV23 vaccine at least 8 weeks after the dose of PCV13 vaccine. Adults older than 32 years of age who have normal immune system function should obtain the PPSV23 vaccine dose at least 1 year after the dose of PCV13 vaccine.  Pneumococcal polysaccharide (PPSV23) vaccine. When PCV13 is also indicated, PCV13 should be obtained first. All adults aged 2 years and older should be immunized. An adult younger than age 30 years who has certain medical conditions should be immunized. Any person who resides in a nursing home or long-term care facility should be immunized. An adult smoker should be immunized. People with an immunocompromised condition and certain other conditions should receive both PCV13 and PPSV23 vaccines. People with human immunodeficiency virus (HIV) infection should be immunized as soon as possible after diagnosis. Immunization during chemotherapy or radiation therapy should be avoided. Routine use of PPSV23 vaccine is not recommended for American Indians, [place] Natives, or people younger than 65 years unless there are medical conditions that require PPSV23 vaccine. When indicated, people who have unknown immunization and have no record of immunization should receive PPSV23 vaccine. One-time revaccination 5 years after the first dose of PPSV23 is recommended for people aged 19-64 years who have chronic kidney failure, nephrotic syndrome, asplenia, or immunocompromised conditions. People who received 1-2 doses of PPSV23 before age 44 years should receive another dose of PPSV23 vaccine at age 83 years or later if at least 5 years have passed since the previous dose. Doses  of PPSV23 are not needed for people immunized with PPSV23 at or after age 20 years.  Meningococcal vaccine. Adults with asplenia or persistent complement component deficiencies should receive 2 doses of quadrivalent meningococcal conjugate (MenACWY-D) vaccine. The doses should be obtained  at least 2 months apart. Microbiologists working with certain meningococcal bacteria, [place] recruits, people at risk during an outbreak, and people who travel to or live in countries with a high rate of meningitis should be immunized. A first-year college student up through age 28 years who is living in a residence hall should receive a dose if she did not receive a dose on or after her 16th birthday. Adults who have certain high-risk conditions should receive one or more doses of vaccine.  Hepatitis A vaccine. Adults who wish to be protected from this disease, have certain high-risk conditions, work with hepatitis A-infected animals, work in hepatitis A research labs, or travel to or work in countries with a high rate of hepatitis A should be immunized. Adults who were previously unvaccinated and who anticipate close contact with an international adoptee during the first 60 days after arrival in the [place] from a country with a high rate of hepatitis A should be immunized.  Hepatitis B vaccine. Adults who wish to be protected from this disease, have certain high-risk conditions, may be exposed to blood or other infectious body fluids, are household contacts or sex partners of hepatitis B positive people, are clients or workers in certain care facilities, or travel to or work in countries with a high rate of hepatitis B should be immunized.  Haemophilus influenzae type b (Hib) vaccine. A previously unvaccinated person with asplenia or sickle cell disease or having a scheduled splenectomy should receive 1 dose of Hib vaccine. Regardless of previous immunization, a recipient of a hematopoietic stem cell transplant  should receive a 3-dose series 6-12 months after her successful transplant. Hib vaccine is not recommended for adults with HIV infection. Preventive Services / Frequency Ages 71 to 87 years  Blood pressure check.** / Every 3-5 years.  Lipid and cholesterol check.** / Every 5 years beginning at age 1.  Clinical breast exam.** / Every 3 years for women in their 3s and 31s.  BRCA-related cancer risk assessment.** / For women who have family members with a BRCA-related cancer (breast, ovarian, tubal, or peritoneal cancers).  Pap test.** / Every 2 years from ages 50 through 86. Every 3 years starting at age 87 through age 7 or 75 with a history of 3 consecutive normal Pap tests.  HPV screening.** / Every 3 years from ages 59 through ages 35 to 6 with a history of 3 consecutive normal Pap tests.  Hepatitis C blood test.** / For any individual with known risks for hepatitis C.  Skin self-exam. / Monthly.  Influenza vaccine. / Every year.  Tetanus, diphtheria, and acellular pertussis (Tdap, Td) vaccine.** / Consult your health care provider. Pregnant women should receive 1 dose of Tdap vaccine during each pregnancy. 1 dose of Td every 10 years.  Varicella vaccine.** / Consult your health care provider. Pregnant females who do not have evidence of immunity should receive the first dose after pregnancy.  HPV vaccine. / 3 doses over 6 months, if 72 and younger. The vaccine is not recommended for use in pregnant females. However, pregnancy testing is not needed before receiving a dose.  Measles, mumps, rubella (MMR) vaccine.** / You need at least 1 dose of MMR if you were born in 1957 or later. You may also need a 2nd dose. For females of childbearing age, rubella immunity should be determined. If there is no evidence of immunity, females who are not pregnant should be vaccinated. If there is no evidence of immunity, females who are  pregnant should delay immunization until after  pregnancy.  Pneumococcal 13-valent conjugate (PCV13) vaccine.** / Consult your health care provider.  Pneumococcal polysaccharide (PPSV23) vaccine.** / 1 to 2 doses if you smoke cigarettes or if you have certain conditions.  Meningococcal vaccine.** / 1 dose if you are age 87 to 44 years and a Market researcher living in a residence hall, or have one of several medical conditions, you need to get vaccinated against meningococcal disease. You may also need additional booster doses.  Hepatitis A vaccine.** / Consult your health care provider.  Hepatitis B vaccine.** / Consult your health care provider.  Haemophilus influenzae type b (Hib) vaccine.** / Consult your health care provider. Ages 86 to 38 years  Blood pressure check.** / Every year.  Lipid and cholesterol check.** / Every 5 years beginning at age 49 years.  Lung cancer screening. / Every year if you are aged 71-80 years and have a 30-pack-year history of smoking and currently smoke or have quit within the past 15 years. Yearly screening is stopped once you have quit smoking for at least 15 years or develop a health problem that would prevent you from having lung cancer treatment.  Clinical breast exam.** / Every year after age 51 years.  BRCA-related cancer risk assessment.** / For women who have family members with a BRCA-related cancer (breast, ovarian, tubal, or peritoneal cancers).  Mammogram.** / Every year beginning at age 18 years and continuing for as long as you are in good health. Consult with your health care provider.  Pap test.** / Every 3 years starting at age 63 years through age 37 or 57 years with a history of 3 consecutive normal Pap tests.  HPV screening.** / Every 3 years from ages 41 years through ages 76 to 23 years with a history of 3 consecutive normal Pap tests.  Fecal occult blood test (FOBT) of stool. / Every year beginning at age 36 years and continuing until age 51 years. You may not need  to do this test if you get a colonoscopy every 10 years.  Flexible sigmoidoscopy or colonoscopy.** / Every 5 years for a flexible sigmoidoscopy or every 10 years for a colonoscopy beginning at age 36 years and continuing until age 35 years.  Hepatitis C blood test.** / For all people born from [birth year] through 1967 and any individual with known risks for hepatitis C.  Skin self-exam. / Monthly.  Influenza vaccine. / Every year.  Tetanus, diphtheria, and acellular pertussis (Tdap/Td) vaccine.** / Consult your health care provider. Pregnant women should receive 1 dose of Tdap vaccine during each pregnancy. 1 dose of Td every 10 years.  Varicella vaccine.** / Consult your health care provider. Pregnant females who do not have evidence of immunity should receive the first dose after pregnancy.  Zoster vaccine.** / 1 dose for adults aged 73 years or older.  Measles, mumps, rubella (MMR) vaccine.** / You need at least 1 dose of MMR if you were born in 1957 or later. You may also need a second dose. For females of childbearing age, rubella immunity should be determined. If there is no evidence of immunity, females who are not pregnant should be vaccinated. If there is no evidence of immunity, females who are pregnant should delay immunization until after pregnancy.  Pneumococcal 13-valent conjugate (PCV13) vaccine.** / Consult your health care provider.  Pneumococcal polysaccharide (PPSV23) vaccine.** / 1 to 2 doses if you smoke cigarettes or if you have certain conditions.  Meningococcal vaccine.** /  Consult your health care provider.  Hepatitis A vaccine.** / Consult your health care provider.  Hepatitis B vaccine.** / Consult your health care provider.  Haemophilus influenzae type b (Hib) vaccine.** / Consult your health care provider. Ages 80 years and over  Blood pressure check.** / Every year.  Lipid and cholesterol check.** / Every 5 years beginning at age 62 years.  Lung cancer  screening. / Every year if you are aged 32-80 years and have a 30-pack-year history of smoking and currently smoke or have quit within the past 15 years. Yearly screening is stopped once you have quit smoking for at least 15 years or develop a health problem that would prevent you from having lung cancer treatment.  Clinical breast exam.** / Every year after age 61 years.  BRCA-related cancer risk assessment.** / For women who have family members with a BRCA-related cancer (breast, ovarian, tubal, or peritoneal cancers).  Mammogram.** / Every year beginning at age 39 years and continuing for as long as you are in good health. Consult with your health care provider.  Pap test.** / Every 3 years starting at age 85 years through age 74 or 72 years with 3 consecutive normal Pap tests. Testing can be stopped between 65 and 70 years with 3 consecutive normal Pap tests and no abnormal Pap or HPV tests in the past 10 years.  HPV screening.** / Every 3 years from ages 55 years through ages 67 or 77 years with a history of 3 consecutive normal Pap tests. Testing can be stopped between 65 and 70 years with 3 consecutive normal Pap tests and no abnormal Pap or HPV tests in the past 10 years.  Fecal occult blood test (FOBT) of stool. / Every year beginning at age 81 years and continuing until age 22 years. You may not need to do this test if you get a colonoscopy every 10 years.  Flexible sigmoidoscopy or colonoscopy.** / Every 5 years for a flexible sigmoidoscopy or every 10 years for a colonoscopy beginning at age 67 years and continuing until age 22 years.  Hepatitis C blood test.** / For all people born from [birth year] through 1967 and any individual with known risks for hepatitis C.  Osteoporosis screening.** / A one-time screening for women ages 8 years and over and women at risk for fractures or osteoporosis.  Skin self-exam. / Monthly.  Influenza vaccine. / Every year.  Tetanus, diphtheria, and  acellular pertussis (Tdap/Td) vaccine.** / 1 dose of Td every 10 years.  Varicella vaccine.** / Consult your health care provider.  Zoster vaccine.** / 1 dose for adults aged 56 years or older.  Pneumococcal 13-valent conjugate (PCV13) vaccine.** / Consult your health care provider.  Pneumococcal polysaccharide (PPSV23) vaccine.** / 1 dose for all adults aged 15 years and older.  Meningococcal vaccine.** / Consult your health care provider.  Hepatitis A vaccine.** / Consult your health care provider.  Hepatitis B vaccine.** / Consult your health care provider.  Haemophilus influenzae type b (Hib) vaccine.** / Consult your health care provider. ** Family history and personal history of risk and conditions may change your health care provider's recommendations.   This information is not intended to replace advice given to you by your health care provider. Make sure you discuss any questions you have with your health care provider.   Document Released: 03/10/2001 Document Revised: 02/02/2014 Document Reviewed: 06/09/2010 Elsevier Interactive Patient Education [employer].

## 2015-08-28 LAB — COMPREHENSIVE METABOLIC PANEL
ALK PHOS: 71 IU/L (ref 39–117)
ALT: 14 IU/L (ref 0–32)
AST: 14 IU/L (ref 0–40)
Albumin/Globulin Ratio: 1.9 (ref 1.2–2.2)
Albumin: 4.6 g/dL (ref 3.5–5.5)
BILIRUBIN TOTAL: 0.4 mg/dL (ref 0.0–1.2)
BUN/Creatinine Ratio: 9 (ref 9–23)
BUN: 8 mg/dL (ref 6–20)
CHLORIDE: 100 mmol/L (ref 96–106)
CO2: 23 mmol/L (ref 18–29)
Calcium: 10.3 mg/dL — ABNORMAL HIGH (ref 8.7–10.2)
Creatinine, Ser: 0.93 mg/dL (ref 0.57–1.00)
GFR calc Af Amer: 95 mL/min/{1.73_m2} (ref >59)
GFR calc non Af Amer: 82 mL/min/{1.73_m2} (ref >59)
GLUCOSE: 73 mg/dL (ref 65–99)
Globulin, Total: 2.4 g/dL (ref 1.5–4.5)
Potassium: 4.9 mmol/L (ref 3.5–5.2)
Sodium: 141 mmol/L (ref 134–144)
TOTAL PROTEIN: 7 g/dL (ref 6.0–8.5)

## 2015-08-28 LAB — LIPID PANEL
CHOLESTEROL TOTAL: 210 mg/dL — AB (ref 100–199)
Chol/HDL Ratio: 5.4 ratio units — ABNORMAL HIGH (ref 0.0–4.4)
HDL: 39 mg/dL — AB (ref >39)
LDL Calculated: 151 mg/dL — ABNORMAL HIGH (ref 0–99)
TRIGLYCERIDES: 102 mg/dL (ref 0–149)
VLDL Cholesterol Cal: 20 mg/dL (ref 5–40)

## 2015-08-28 LAB — FSH/LH
FSH: 8.3 m[IU]/mL
LH: 17.6 m[IU]/mL

## 2015-08-28 LAB — BETA HCG QUANT (REF LAB): HCG QUANT: 1 m[IU]/mL

## 2015-08-28 LAB — THYROID PANEL WITH TSH
FREE THYROXINE INDEX: 3.3 (ref 1.2–4.9)
T3 Uptake Ratio: 33 % (ref 24–39)
T4, Total: 9.9 ug/dL (ref 4.5–12.0)
TSH: 1.05 u[IU]/mL (ref 0.450–4.500)

## 2015-09-03 ENCOUNTER — Encounter: Payer: Self-pay | Admitting: Obstetrics and Gynecology

## 2015-12-02 ENCOUNTER — Other Ambulatory Visit: Payer: Self-pay | Admitting: *Deleted

## 2015-12-02 MED ORDER — ALPRAZOLAM 0.5 MG PO TABS: 0.5000 mg | ORAL_TABLET | Freq: Three times a day (TID) | ORAL | 2 refills | Status: DC | PRN

## 2016-01-30 ENCOUNTER — Ambulatory Visit: Payer: 59 | Admitting: Primary Care

## 2016-01-31 ENCOUNTER — Ambulatory Visit (INDEPENDENT_AMBULATORY_CARE_PROVIDER_SITE_OTHER): Payer: 59 | Admitting: Primary Care

## 2016-01-31 ENCOUNTER — Encounter: Payer: Self-pay | Admitting: Primary Care

## 2016-01-31 VITALS — BP 120/80 | HR 66 | Temp 98.0°F | Ht 64.0 in | Wt 140.0 lb

## 2016-01-31 DIAGNOSIS — J069 Acute upper respiratory infection, unspecified: Secondary | ICD-10-CM | POA: Diagnosis not present

## 2016-01-31 MED ORDER — CEPHALEXIN 500 MG PO CAPS: 500.0000 mg | ORAL_CAPSULE | Freq: Two times a day (BID) | ORAL | 0 refills | Status: DC

## 2016-01-31 NOTE — Progress Notes (Signed)
Subjective:    Patient ID: Alexis Petersen, female    DOB: 07/01/1985, 31 y.o.   MRN: [id number]  HPI  [name] is a 31 year old female who presents today with a chief complaint of ear pain. Her pain is located to her bilateral ears. She also reports nasal congestion, fever, sore throat, cough. Her symptoms began 2-3 weeks ago. She has a history of recurrent ear infections. She saw ENT in 2017 and was told she has hearing loss, but otherwise unremarkable exam. She's taken Alka-Selzer, Thera-Flu, tylenol, hot tea, Mucinex without much improvement. Her fever yesterday was 104.0. She believes she has an allergy to PCN but can take Cephalexin and Amoxicillin.  Review of Systems  Constitutional: Positive for chills, fatigue and fever.  HENT: Positive for congestion, ear pain and sore throat.   Respiratory: Positive for cough. Negative for shortness of breath and wheezing.   Cardiovascular: Negative for chest pain.       Past Medical History:  Diagnosis Date  . Anxiety   . Anxiety and depression   . Depression   . Gestational diabetes   . Migraine without aura      Social History   Social History  . Marital status: Single    Spouse name: N/A  . Number of children: N/A  . Years of education: N/A   Occupational History  . Not on file.   Social History Main Topics  . Smoking status: Current Every Day Smoker    Packs/day: 0.50    Years: 12.00    Types: Cigarettes  . Smokeless tobacco: Never Used  . Alcohol use No  . Drug use: No  . Sexual activity: Yes    Birth control/ protection: Condom     Comment: depo provera   Other Topics Concern  . Not on file   Social History Narrative   Married 2015   1 child   Artist- likes to draw    Past Surgical History:  Procedure Laterality Date  . CESAREAN SECTION  2016  . WISDOM TOOTH EXTRACTION  2014    Family History  Problem Relation Age of Onset  . Hypertension Father   . Stroke Father     Allergies  Allergen  Reactions  . Benadryl [Diphenhydramine Hcl (Sleep)]   . Penicillins   . Sulfa Antibiotics     Current Outpatient Prescriptions on File Prior to Visit  Medication Sig Dispense Refill  . ALPRAZolam (XANAX) 0.5 MG tablet Take 1 tablet (0.5 mg total) by mouth 3 (three) times daily as needed for anxiety. 60 tablet 2  . FLUoxetine (PROZAC) 20 MG tablet Take 1 tablet (20 mg total) by mouth daily. 30 tablet 6  . norethindrone-ethinyl estradiol (JUNEL FE 1/20) 1-20 MG-MCG tablet Take 1 tablet by mouth daily. (Patient not taking: Reported on 01/31/2016) 1 Package 11  . zolmitriptan (ZOMIG) 5 MG tablet Take 0.5-1 tablets (2.5-5 mg total) by mouth daily as needed for migraine (try to limit use as much as possible). (Patient not taking: Reported on 01/31/2016) 10 tablet 1   No current facility-administered medications on file prior to visit.     BP 120/80   Pulse 66   Temp 98 F (36.7 C) (Oral)   Ht 5\' 4"  (1.626 m)   Wt 140 lb (63.5 kg)   SpO2 98%   BMI 24.03 kg/m    Objective:   Physical Exam  Constitutional: She appears well-nourished. She appears ill.  HENT:  Right Ear: Ear canal  normal. Tympanic membrane is erythematous.  Left Ear: Ear canal normal. Tympanic membrane is erythematous.  Nose: Right sinus exhibits no maxillary sinus tenderness and no frontal sinus tenderness. Left sinus exhibits no maxillary sinus tenderness and no frontal sinus tenderness.  Mouth/Throat: Oropharynx is clear and moist.  Bilateral canals with inflammation  Eyes: Conjunctivae are normal.  Neck: Neck supple.  Cardiovascular: Normal rate and regular rhythm.   Pulmonary/Chest: Effort normal. She has no decreased breath sounds. She has no wheezes. She has rhonchi in the right upper field and the left upper field. She has no rales.  Lymphadenopathy:    She has no cervical adenopathy.  Skin: Skin is warm and dry.          Assessment & Plan:  Upper Respiratory Infection:  Cough, bilateral ear pain, fevers  x 2-3 weeks. Feeling worse now, no improvement with OTC treatment. Exam today with evidence of bacterial involvement. Given presentation and duration of symptoms will treat. Rx for Cephalexin course sent to pharmacy. Verified that she can tolerate Cephalexin as she's had this numerous times before. Continue Mucinex, start Aleve, fluids, rest. Follow up PRN.  Morrie Sheldonlark,Creighton Longley Kendal, NP

## 2016-01-31 NOTE — Patient Instructions (Signed)
Start Cephalexin antibiotics. Take 1 capsule by mouth twice daily for 7 days.  Continue Mucinex. Ensure you are staying hydrated with water and rest.  It was a pleasure meeting you!

## 2016-01-31 NOTE — Progress Notes (Signed)
Pre visit review using our clinic review tool, if applicable. No additional management support is needed unless otherwise documented below in the visit note. 

## 2016-02-03 ENCOUNTER — Telehealth: Payer: Self-pay | Admitting: *Deleted

## 2016-02-03 DIAGNOSIS — J069 Acute upper respiratory infection, unspecified: Secondary | ICD-10-CM

## 2016-02-03 MED ORDER — AZITHROMYCIN 250 MG PO TABS: ORAL_TABLET | ORAL | 0 refills | Status: DC

## 2016-02-03 NOTE — Telephone Encounter (Signed)
Patient verified numerous times that she's had cephalexin in the past without problems. I'll change it to Azithromycin, so do not take Cephalexin. Take 2 tablets today, then 1 tablet daily for 4 additional days. Please call the pharmacy and cancel Cephalexin.

## 2016-02-03 NOTE — Telephone Encounter (Signed)
Patient left a voicemail stating that she was given an antibiotic last week and was told by the pharmacist that she could possibly have an allergic reaction because of her allergies. Patient wants to know if it is okay to fill the script or should something else be sent in? Please advise

## 2016-02-03 NOTE — Telephone Encounter (Signed)
Left vm at pharmacy to cancel cephalexin.   Spoke to pt. Advised her to take the z-pak

## 2016-02-07 ENCOUNTER — Telehealth: Payer: Self-pay | Admitting: *Deleted

## 2016-02-07 NOTE — Telephone Encounter (Signed)
Pt lmom stating that she was on abx last week for ear infection, and today noticed a little blood in her urine. She wanted to know if abx she was on would treat that or if she would need to be seen.  I called back and received voicemail. I left a detailed message ( as authorized per DPR) that she does need to be evaluated, and urged her to go to an UC today or [facility].

## 2016-02-07 NOTE — Telephone Encounter (Signed)
Agreed, needs eval.  Thanks.

## 2016-03-17 ENCOUNTER — Emergency Department: Payer: 59

## 2016-03-17 ENCOUNTER — Telehealth: Payer: Self-pay | Admitting: Family Medicine

## 2016-03-17 ENCOUNTER — Emergency Department
Admission: EM | Admit: 2016-03-17 | Discharge: 2016-03-17 | Disposition: A | Discharge status: Home / Self Care | Payer: 59 | Attending: Emergency Medicine | Admitting: Emergency Medicine

## 2016-03-17 ENCOUNTER — Encounter: Payer: Self-pay | Admitting: *Deleted

## 2016-03-17 DIAGNOSIS — R319 Hematuria, unspecified: Secondary | ICD-10-CM | POA: Diagnosis not present

## 2016-03-17 DIAGNOSIS — F17210 Nicotine dependence, cigarettes, uncomplicated: Secondary | ICD-10-CM | POA: Insufficient documentation

## 2016-03-17 DIAGNOSIS — R0789 Other chest pain: Secondary | ICD-10-CM | POA: Insufficient documentation

## 2016-03-17 DIAGNOSIS — R079 Chest pain, unspecified: Secondary | ICD-10-CM

## 2016-03-17 LAB — URINALYSIS, ROUTINE W REFLEX MICROSCOPIC
Bilirubin Urine: NEGATIVE
Glucose, UA: NEGATIVE mg/dL
Hgb urine dipstick: NEGATIVE
Ketones, ur: NEGATIVE mg/dL
LEUKOCYTES UA: NEGATIVE
NITRITE: NEGATIVE
Protein, ur: NEGATIVE mg/dL
SPECIFIC GRAVITY, URINE: 1.009 (ref 1.005–1.030)
pH: 8 (ref 5.0–8.0)

## 2016-03-17 LAB — CBC
HEMATOCRIT: 43.9 % (ref 35.0–47.0)
HEMOGLOBIN: 15.4 g/dL (ref 12.0–16.0)
MCH: 32.4 pg (ref 26.0–34.0)
MCHC: 35 g/dL (ref 32.0–36.0)
MCV: 92.5 fL (ref 80.0–100.0)
Platelets: 301 10*3/uL (ref 150–440)
RBC: 4.75 MIL/uL (ref 3.80–5.20)
RDW: 13.5 % (ref 11.5–14.5)
WBC: 10.9 10*3/uL (ref 3.6–11.0)

## 2016-03-17 LAB — BASIC METABOLIC PANEL
ANION GAP: 9 (ref 5–15)
BUN: 9 mg/dL (ref 6–20)
CHLORIDE: 105 mmol/L (ref 101–111)
CO2: 25 mmol/L (ref 22–32)
Calcium: 9.7 mg/dL (ref 8.9–10.3)
Creatinine, Ser: 0.77 mg/dL (ref 0.44–1.00)
GFR calc Af Amer: >60 mL/min (ref >60)
GLUCOSE: 106 mg/dL — AB (ref 65–99)
POTASSIUM: 3.7 mmol/L (ref 3.5–5.1)
SODIUM: 139 mmol/L (ref 135–145)

## 2016-03-17 LAB — POCT PREGNANCY, URINE: PREG TEST UR: NEGATIVE

## 2016-03-17 LAB — TROPONIN I: Troponin I: <0.03 ng/mL (ref <0.03)

## 2016-03-17 MED ORDER — LORAZEPAM 2 MG/ML IJ SOLN
0.5000 mg | Freq: Once | INTRAMUSCULAR | Status: AC
  Administered 2016-03-17: 0.5 mg via INTRAVENOUS
  Filled 2016-03-17: qty 1

## 2016-03-17 NOTE — Telephone Encounter (Signed)
TELEPHONE ADVICE RECORD [facility]  Patient Name: Alexis Petersen  DOB: May 22, 1985    Initial Comment Caller states she is trying to make an apt. Caller states she has been urinating blood on and off for about 3 weeks. Has chest pain and has a knot on her back. Has had the chest pain for a while and is starting to get worse.    Nurse Assessment  Nurse: Odis LusterBowers, RN, Bjorn Loserhonda Date/Time Lamount Cohen(Eastern Time): 03/17/2016 4:18:47 PM  Confirm and document reason for call. If symptomatic, describe symptoms. ---Caller states she is trying to make an apt. Caller states she has been urinating blood on and off for about 3 weeks. Has chest pain and has a knot on her back. Has had the chest pain for a while and is starting to get worse.  Does the patient have any new or worsening symptoms? ---Yes  Will a triage be completed? ---Yes  Related visit to physician within the last 2 weeks? ---No  Does the PT have any chronic conditions? (i.e. diabetes, asthma, etc.) ---Yes  List chronic conditions. ---anxiety  Is the patient pregnant or possibly pregnant? (Ask all females between the ages of 6412-55) ---No  Is this a behavioral health or substance abuse call? ---No     Guidelines    Guideline Title Affirmed Question Affirmed Notes  Chest Pain [1] Chest pain lasts > 5 minutes AND [2] age > 5130 AND [3] at least one cardiac risk factor (i.e., hypertension, diabetes, obesity, smoker or strong family history of heart disease)    Final Disposition User   Call EMS 911 Now Odis LusterBowers, RN, Bjorn Loserhonda    Disagree/Comply: Comply

## 2016-03-17 NOTE — Discharge Instructions (Signed)
You have been seen in the emergency department today for chest pain. Your workup has shown normal results. As we discussed please follow-up with your primary care physician in the next 1-2 days for recheck. Return to the emergency department for any further chest pain, trouble breathing, or any other symptom personally concerning to yourself.  Please call the number provided tomorrow morning for cardiology to arrange a follow-up appointment as soon as possible.

## 2016-03-17 NOTE — ED Notes (Signed)
Pt up to restroom with steady gait. No distress noted at this time. Pt states she has continued pain.

## 2016-03-17 NOTE — ED Triage Notes (Signed)
Pt  To ED reporting left sided chest pain beginning over 1 week ago. Pt reports pain has started to radiate down left arm and up into neck. SOB and lightheadedness reported as well with nausea beginning today. Pt reports having a productive cough with green sputum and hx of panic attacks. Pt also reports having blood in urine fior the past three weeks. Pt reports some days she can not urinate at all and some days she is urinating increased amounts. Pt denies seeing clots in urine and verbalized "I thought it was blood but my urine is just orange and cloudy"

## 2016-03-17 NOTE — ED Provider Notes (Signed)
[facility] Emergency Department Provider Note  Time seen: 6:52 PM  I have reviewed the triage vital signs and the nursing notes.   HISTORY  Chief Complaint Chest Pain and Hematuria    HPI Alexis Petersen is a 31 y.o. female with a past medical history of anxiety, depression, presents to the emergency department with chest discomfort and hematuria. According to the patient the past 2 years she has been expanding intermittent left-sided chest discomfort. She states she has mentioned it to her primary care doctor but they've never referred her to a cardiologist. Patient states over the past one week the chest discomfort has been somewhat worse which she describes as a burning sensation with occasional shooting pain to the left arm. Patient also states for the past 3 weeks she has been experiencing light in her urine or dark urine, the patient is not sure. Denies any history of hematuria in the past. Denies any vaginal bleeding. Denies abdominal pain or dysuria.  Past Medical History:  Diagnosis Date  . Anxiety   . Anxiety and depression   . Depression   . Gestational diabetes   . Migraine without aura     Patient Active Problem List   Diagnosis Date Noted  . History of gestational diabetes 04/25/2015  . Migraine without aura 04/25/2015  . Well woman exam with routine gynecological exam 08/22/2014  . Anxiety and depression 05/22/2014    Past Surgical History:  Procedure Laterality Date  . CESAREAN SECTION  2016  . WISDOM TOOTH EXTRACTION  2014    Prior to Admission medications   Medication Sig Start Date End Date Taking? Authorizing Provider  ALPRAZolam Prudy Feeler(XANAX) 0.5 MG tablet Take 1 tablet (0.5 mg total) by mouth 3 (three) times daily as needed for anxiety. 12/02/15   Melody N Shambley, CNM  FLUoxetine (PROZAC) 20 MG tablet Take 1 tablet (20 mg total) by mouth daily. 03/13/15   Melody N Shambley, CNM  norethindrone-ethinyl estradiol (JUNEL FE 1/20) 1-20  MG-MCG tablet Take 1 tablet by mouth daily. Patient not taking: Reported on 01/31/2016 03/13/15   Melody N Shambley, CNM  zolmitriptan (ZOMIG) 5 MG tablet Take 0.5-1 tablets (2.5-5 mg total) by mouth daily as needed for migraine (try to limit use as much as possible). Patient not taking: Reported on 01/31/2016 04/23/15   Joaquim NamGraham S Duncan, MD    Allergies  Allergen Reactions  . Benadryl [Diphenhydramine Hcl (Sleep)]   . Penicillins   . Sulfa Antibiotics     Family History  Problem Relation Age of Onset  . Hypertension Father   . Stroke Father     Social History Social History  Substance Use Topics  . Smoking status: Current Every Day Smoker    Packs/day: 0.50    Years: 12.00    Types: Cigarettes  . Smokeless tobacco: Never Used  . Alcohol use No    Review of Systems Constitutional: Negative for fever. Cardiovascular: Intermittent chest pain 2 years. Respiratory: Negative for shortness of breath. Gastrointestinal: Negative for abdominal pain Genitourinary: Positive for dark urine/bloody urine. Neurological: Negative for headache 10-point ROS otherwise negative.  ____________________________________________   PHYSICAL EXAM:  VITAL SIGNS: ED Triage Vitals  Enc Vitals Group     BP 03/17/16 1815 119/80     Pulse Rate 03/17/16 1815 73     Resp 03/17/16 1815 16     Temp 03/17/16 1815 98.6 F (37 C)     Temp Source 03/17/16 1815 Oral     SpO2  03/17/16 1815 100 %     Weight 03/17/16 1816 150 lb (68 kg)     Height 03/17/16 1816 5\' 4"  (1.626 m)     Head Circumference --      Peak Flow --      Pain Score 03/17/16 1816 1     Pain Loc --      Pain Edu? --      Excl. in GC? --     Constitutional: Alert and oriented. Well appearing and in no distress. Eyes: Normal exam ENT   Head: Normocephalic and atraumatic.   Mouth/Throat: Mucous membranes are moist. Cardiovascular: Normal rate, regular rhythm. No murmur. Chest is nontender to palpation. Respiratory: Normal  respiratory effort without tachypnea nor retractions. Breath sounds are clear Gastrointestinal: Soft and nontender. No distention.  Musculoskeletal: Nontender with normal range of motion in all extremities.  Neurologic:  Normal speech and language. No gross focal neurologic deficits are appreciated. Skin:  Skin is warm, dry and intact.  Psychiatric: Mood and affect are normal.   ____________________________________________    EKG  EKG reviewed and interpreted by myself shows normal sinus rhythm at 71 bpm, narrow QRS, normal axis, normal intervals, no ST changes. Normal EKG.  ____________________________________________    RADIOLOGY  Chest x-ray negative  ____________________________________________   INITIAL IMPRESSION / ASSESSMENT AND PLAN / ED COURSE  Pertinent labs & imaging results that were available during my care of the patient were reviewed by me and considered in my medical decision making (see chart for details).  The patient presents to the emergency department with 2 years of intermittent left-sided chest discomfort worse over the past 1 week. She also states for 3 weeks she is having dark urine or bloody urine. Denies abdominal pain or vaginal bleeding. Patient's EKG is reassuring. We will obtain labs including cardiac enzymes, chest x-ray, and urinalysis.  Labs are largely within normal limits. Troponin negative. POC pregnancy test is negative (having trouble getting this to show up on that day, but verified to be negative). Overall patient appears well. With 2 years of intermittent symptoms and believe the patient would benefit from cardiology follow-up. She will call the number provided to arrange a follow-up appointment as soon as possible for further evaluation.  ____________________________________________   FINAL CLINICAL IMPRESSION(S) / ED DIAGNOSES  Chest pain     Minna Antis, MD 03/17/16 2045

## 2016-04-06 ENCOUNTER — Ambulatory Visit: Payer: 59 | Admitting: Family Medicine

## 2016-04-13 ENCOUNTER — Ambulatory Visit (INDEPENDENT_AMBULATORY_CARE_PROVIDER_SITE_OTHER): Payer: 59 | Admitting: Family Medicine

## 2016-04-13 ENCOUNTER — Ambulatory Visit (INDEPENDENT_AMBULATORY_CARE_PROVIDER_SITE_OTHER)
Admission: RE | Admit: 2016-04-13 | Discharge: 2016-04-13 | Disposition: A | Discharge status: Home / Self Care | Payer: 59 | Source: Ambulatory Visit | Attending: Family Medicine | Admitting: Family Medicine

## 2016-04-13 ENCOUNTER — Encounter: Payer: Self-pay | Admitting: Family Medicine

## 2016-04-13 VITALS — BP 102/60 | HR 65 | Temp 98.5°F | Wt 139.5 lb

## 2016-04-13 DIAGNOSIS — R202 Paresthesia of skin: Secondary | ICD-10-CM | POA: Diagnosis not present

## 2016-04-13 LAB — POCT URINE PREGNANCY: PREG TEST UR: NEGATIVE

## 2016-04-13 MED ORDER — THERA VITAL M PO TABS: 1.0000 | ORAL_TABLET | Freq: Every day | ORAL | Status: DC

## 2016-04-13 NOTE — Progress Notes (Signed)
Pre visit review using our clinic review tool, if applicable. No additional management support is needed unless otherwise documented below in the visit note. 

## 2016-04-13 NOTE — Patient Instructions (Signed)
Go to the lab on the way out.  We'll contact you with your lab and xray report. Get an over the counter wrist brace, start using that and see if it helps.  We'll be in touch, update me if not any better in the meantime.  Take care.  Glad to see you.

## 2016-04-13 NOTE — Progress Notes (Signed)
ER f/u.  ER notes reviewed with patient, below.  She is considering smoking cessation.  Smoking about 1 PPD.  Doesn't have a quit date yet.  He wants to quit because it costs to much and makes her feel bad.  D/w pt.  She has tried regular gum, not nicotine gum.  D/w pt.  She failed cold [place].  She'll consider and update me as needed.  She is thinking about using nicotine gum, d/w pt.   She is off OCPs.  Was prev on depo shot.  She wanted to use condoms in the meantime.  D/w pt about starting prenatal vitamin.   See med list.    She has L sided chest and arm and neck pain, waxes and wanes but is always present for years.  Will radiate to the L chest wall anteriorly to the midline then stop, w/o rash.   Went to ER, EKG neg.  CXR neg.  Lab w/u neg at ER.  Going on for over 2 years per patient report.  She is compensating with her R arm when driving, but w/o relief.    She didn't attribute this episode to anxiety.  She does have the episodic feeling of someone sitting on her chest.  She occ snores, occ has a "rattle" when laying down.    Some numbness in the L hand.  Tingling in the L arm episodically.  Dec in grip strength B.  She has neck pain at baseline.    PMH and SH reviewed  ROS: Per HPI unless specifically indicated in ROS section   Meds, vitals, and allergies reviewed.   nad ncat Neck supple, no LA rrr ctab No rash Normal ROM B shoulders and elbows B wrist Phalen neg but L tinel positive.   Slightly weaker grip B, with slight dec in pincher grasp between the first 2 digits on either hand Normal radial pulses B CN 2-12 wnl B, S/S/DTR wnl x4 o/w.  Cerebellar testing wnl

## 2016-04-14 DIAGNOSIS — R202 Paresthesia of skin: Secondary | ICD-10-CM | POA: Insufficient documentation

## 2016-04-14 LAB — VITAMIN B12: Vitamin B-12: 862 pg/mL (ref 211–911)

## 2016-04-14 LAB — TSH: TSH: 1.31 u[IU]/mL (ref 0.35–4.50)

## 2016-04-14 NOTE — Assessment & Plan Note (Signed)
She may have multiple issues going on. She has tingling in the hand with left wrist Tinel testing. She could have carpal tunnel symptoms. Reasonable to start over-the-counter bracing. Discussed with patient.  She has left-sided neck pain. She may have a mechanical issue causing radicular symptoms that radiate into the left arm and left chest wall. Her symptoms tend to radiate along the left chest wall but then stop at the midline. She has no rash to suggest zoster.  Reasonable to check B12 and TSH given paresthesias in general.  Rationale for all this discussed with patient. We will assemble a red and then update patient. She will update me about her symptoms after she has been using a brace on her left wrist.  She is low risk for cardiopulmonary source of her symptoms. She doesn't think she has a cardiac issue going on, and I agree. Okay for outpatient follow-up. It does not look like the symptoms are related to or a attributable to anxiety.  >25 minutes spent in face to face time with patient, >50% spent in counselling or coordination of care.

## 2016-05-25 ENCOUNTER — Other Ambulatory Visit: Payer: Self-pay | Admitting: Obstetrics and Gynecology

## 2016-08-28 ENCOUNTER — Encounter: Payer: PRIVATE HEALTH INSURANCE | Admitting: Obstetrics and Gynecology

## 2016-09-03 ENCOUNTER — Encounter: Payer: PRIVATE HEALTH INSURANCE | Admitting: Obstetrics and Gynecology

## 2017-02-11 ENCOUNTER — Encounter: Payer: Self-pay | Admitting: Obstetrics and Gynecology

## 2017-02-11 ENCOUNTER — Ambulatory Visit (INDEPENDENT_AMBULATORY_CARE_PROVIDER_SITE_OTHER): Payer: 59 | Admitting: Obstetrics and Gynecology

## 2017-02-11 VITALS — BP 100/54 | HR 60 | Wt 134.0 lb

## 2017-02-11 DIAGNOSIS — G43009 Migraine without aura, not intractable, without status migrainosus: Secondary | ICD-10-CM

## 2017-02-11 DIAGNOSIS — F329 Major depressive disorder, single episode, unspecified: Secondary | ICD-10-CM

## 2017-02-11 DIAGNOSIS — Z3A11 11 weeks gestation of pregnancy: Secondary | ICD-10-CM

## 2017-02-11 DIAGNOSIS — F32A Depression, unspecified: Secondary | ICD-10-CM

## 2017-02-11 DIAGNOSIS — Z8632 Personal history of gestational diabetes: Secondary | ICD-10-CM

## 2017-02-11 DIAGNOSIS — O0993 Supervision of high risk pregnancy, unspecified, third trimester: Secondary | ICD-10-CM | POA: Insufficient documentation

## 2017-02-11 DIAGNOSIS — F419 Anxiety disorder, unspecified: Secondary | ICD-10-CM

## 2017-02-11 DIAGNOSIS — O0990 Supervision of high risk pregnancy, unspecified, unspecified trimester: Secondary | ICD-10-CM

## 2017-02-11 LAB — HM PAP SMEAR: HM Pap smear: NEGATIVE

## 2017-02-11 NOTE — Patient Instructions (Signed)
First Trimester of Pregnancy The first trimester of pregnancy is from week 1 until the end of week 13 (months 1 through 3). During this time, your baby will begin to develop inside you. At 6-8 weeks, the eyes and face are formed, and the heartbeat can be seen on ultrasound. At the end of 12 weeks, all the baby's organs are formed. Prenatal care is all the medical care you receive before the birth of your baby. Make sure you get good prenatal care and follow all of your doctor's instructions. Follow these instructions at home: Medicines  Take over-the-counter and prescription medicines only as told by your doctor. Some medicines are safe and some medicines are not safe during pregnancy.  Take a prenatal vitamin that contains at least 600 micrograms (mcg) of folic acid.  If you have trouble pooping (constipation), take medicine that will make your stool soft (stool softener) if your doctor approves. Eating and drinking  Eat regular, healthy meals.  Your doctor will tell you the amount of weight gain that is right for you.  Avoid raw meat and uncooked cheese.  If you feel sick to your stomach (nauseous) or throw up (vomit): ? Eat 4 or 5 small meals a day instead of 3 large meals. ? Try eating a few soda crackers. ? Drink liquids between meals instead of during meals.  To prevent constipation: ? Eat foods that are high in fiber, like fresh fruits and vegetables, whole grains, and beans. ? Drink enough fluids to keep your pee (urine) clear or pale yellow. Activity  Exercise only as told by your doctor. Stop exercising if you have cramps or pain in your lower belly (abdomen) or low back.  Do not exercise if it is too hot, too humid, or if you are in a place of great height (high altitude).  Try to avoid standing for long periods of time. Move your legs often if you must stand in one place for a long time.  Avoid heavy lifting.  Wear low-heeled shoes. Sit and stand up straight.  You  can have sex unless your doctor tells you not to. Relieving pain and discomfort  Wear a good support bra if your breasts are sore.  Take warm water baths (sitz baths) to soothe pain or discomfort caused by hemorrhoids. Use hemorrhoid cream if your doctor says it is okay.  Rest with your legs raised if you have leg cramps or low back pain.  If you have puffy, bulging veins (varicose veins) in your legs: ? Wear support hose or compression stockings as told by your doctor. ? Raise (elevate) your feet for 15 minutes, 3-4 times a day. ? Limit salt in your food. Prenatal care  Schedule your prenatal visits by the twelfth week of pregnancy.  Write down your questions. Take them to your prenatal visits.  Keep all your prenatal visits as told by your doctor. This is important. Safety  Wear your seat belt at all times when driving.  Make a list of emergency phone numbers. The list should include numbers for family, friends, the hospital, and police and fire departments. General instructions  Ask your doctor for a referral to a local prenatal class. Begin classes no later than at the start of month 6 of your pregnancy.  Ask for help if you need counseling or if you need help with nutrition. Your doctor can give you advice or tell you where to go for help.  Do not use hot tubs, steam rooms, or   saunas.  Do not douche or use tampons or scented sanitary pads.  Do not cross your legs for long periods of time.  Avoid all herbs and alcohol. Avoid drugs that are not approved by your doctor.  Do not use any tobacco products, including cigarettes, chewing tobacco, and electronic cigarettes. If you need help quitting, ask your doctor. You may get counseling or other support to help you quit.  Avoid cat litter boxes and soil used by cats. These carry germs that can cause birth defects in the baby and can cause a loss of your baby (miscarriage) or stillbirth.  Visit your dentist. At home, brush  your teeth with a soft toothbrush. Be gentle when you floss. Contact a doctor if:  You are dizzy.  You have mild cramps or pressure in your lower belly.  You have a nagging pain in your belly area.  You continue to feel sick to your stomach, you throw up, or you have watery poop (diarrhea).  You have a bad smelling fluid coming from your vagina.  You have pain when you pee (urinate).  You have increased puffiness (swelling) in your face, hands, legs, or ankles. Get help right away if:  You have a fever.  You are leaking fluid from your vagina.  You have spotting or bleeding from your vagina.  You have very bad belly cramping or pain.  You gain or lose weight rapidly.  You throw up blood. It may look like coffee grounds.  You are around people who have German measles, fifth disease, or chickenpox.  You have a very bad headache.  You have shortness of breath.  You have any kind of trauma, such as from a fall or a car accident. Summary  The first trimester of pregnancy is from week 1 until the end of week 13 (months 1 through 3).  To take care of yourself and your unborn baby, you will need to eat healthy meals, take medicines only if your doctor tells you to do so, and do activities that are safe for you and your baby.  Keep all follow-up visits as told by your doctor. This is important as your doctor will have to ensure that your baby is healthy and growing well. This information is not intended to replace advice given to you by your health care provider. Make sure you discuss any questions you have with your health care provider. Document Released: 07/01/2007 Document Revised: 01/21/2016 Document Reviewed: 01/21/2016 Elsevier Interactive Patient Education  2017 Elsevier Inc.  

## 2017-02-11 NOTE — Progress Notes (Signed)
02/11/2017   Chief Complaint: Missed period  Transfer of Care Patient: no  History of Present Illness: [name] is a 32 y.o. G2P1001 3949w1d based on Patient's last menstrual period was 11/25/2016. with an Estimated Date of Delivery: 09/01/17, with the above CC.   Her periods were: regular periods every 30 days She was using condoms when she conceived.  She has Negative signs or symptoms of nausea/vomiting of pregnancy. She has Negative signs or symptoms of miscarriage or preterm labor She identifies Negative Zika risk factors for her and her partner On any different medications around the time she conceived/early pregnancy: No  History of varicella: Yes   ROS: A 12-point review of systems was performed and negative, except as stated in the above HPI.  OBGYN History: As per HPI. OB History  Gravida Para Term Preterm AB Living  2 1 1     1   SAB TAB Ectopic Multiple Live Births          1    # Outcome Date GA Lbr Len/2nd Weight Sex Delivery Anes PTL Lv  2 Current           1 Term 03/07/14   5 lb 13 oz (2.637 kg) M CS-LTranv   LIV      Any issues with any prior pregnancies: yes, gestational diabetes, diet controlled Any prior children are healthy, doing well, without any problems or issues: yes History of pap smears: Yes. Last pap smear 2015 History of STIs: No   Past Medical History: Past Medical History:  Diagnosis Date  . Anxiety   . Anxiety and depression   . Depression   . Gestational diabetes   . Migraine without aura     Past Surgical History: Past Surgical History:  Procedure Laterality Date  . CESAREAN SECTION  2016  . WISDOM TOOTH EXTRACTION  2014    Family History:  Family History  Problem Relation Age of Onset  . Hypertension Father   . Stroke Father    She denies any female cancers, bleeding or blood clotting disorders.  She denies any history of mental retardation, birth defects or genetic disorders in her or the FOB's history  Social History:   Social History   Socioeconomic History  . Marital status: Single    Spouse name: Not on file  . Number of children: Not on file  . Years of education: Not on file  . Highest education level: Not on file  Social Needs  . Financial resource strain: Not on file  . Food insecurity - worry: Not on file  . Food insecurity - inability: Not on file  . Transportation needs - medical: Not on file  . Transportation needs - non-medical: Not on file  Occupational History  . Not on file  Tobacco Use  . Smoking status: Current Every Day Smoker    Packs/day: 0.50    Years: 12.00    Pack years: 6.00    Types: Cigarettes  . Smokeless tobacco: Never Used  Substance and Sexual Activity  . Alcohol use: No  . Drug use: No  . Sexual activity: Yes    Birth control/protection: Condom    Comment: depo provera  Other Topics Concern  . Not on file  Social History Narrative   Married 2015   1 child   Artist- likes to draw   Any pets in the household: yes Outdoor cats. Doe snot have liter box, knows not the change liter.   Allergy: Allergies  Allergen  Reactions  . Benadryl [Diphenhydramine Hcl (Sleep)]   . Penicillins   . Sulfa Antibiotics     Current Outpatient Medications:  Current Outpatient Medications:  .  zolmitriptan (ZOMIG) 5 MG tablet, Take 0.5-1 tablets (2.5-5 mg total) by mouth daily as needed for migraine (try to limit use as much as possible)., Disp: 10 tablet, Rfl: 1 .  ALPRAZolam (XANAX) 0.5 MG tablet, Take 1 tablet (0.5 mg total) by mouth 3 (three) times daily as needed for anxiety. (Patient not taking: Reported on 02/11/2017), Disp: 60 tablet, Rfl: 2 .  FLUoxetine (PROZAC) 20 MG tablet, TAKE ONE TABLET BY MOUTH ONCE DAILY (Patient not taking: Reported on 02/11/2017), Disp: 30 tablet, Rfl: 6   Physical Exam:   BP (!) 100/54   Pulse 60   Wt 134 lb (60.8 kg)   LMP 11/25/2016 Comment: using condoms, ok to do per Dr Para March  BMI 23.00 kg/m  Body mass index is 23  kg/m. Constitutional: Well nourished, well developed female in no acute distress.  Neck:  Supple, normal appearance, and no thyromegaly  Cardiovascular: S1, S2 normal, no murmur, rub or gallop, regular rate and rhythm Respiratory:  Clear to auscultation bilateral. Normal respiratory effort Abdomen: positive bowel sounds and no masses, hernias; diffusely non tender to palpation, non distended Breasts: breasts appear normal, no suspicious masses, no skin or nipple changes or axillary nodes. Neuro/Psych:  Normal mood and affect.  Skin:  Warm and dry.  Lymphatic:  No inguinal lymphadenopathy.   Pelvic exam: is not limited by body habitus EGBUS: within normal limits, Vagina: within normal limits and with no blood in the vault, Cervix: normal appearing cervix without discharge or lesions, closed/long/high, Uterus:  enlarged: 10cm, and Adnexa:  normal adnexa and no mass, fullness, tenderness  Bedside abdominal US showed an IUP with CRL equal to 10 weeks 0 days. FHR present  Assessment: Ms. Pollick is a 32 y.o. G2P1001 [redacted]w[redacted]d based on Patient's last menstrual period was 11/25/2016. with an Estimated Date of Delivery: 09/01/17,  for prenatal care.  Plan:  1) Avoid alcoholic beverages. 2) Patient encouraged not to smoke.  3) Discontinue the use of all non-medicinal drugs and chemicals.  4) Take prenatal vitamins daily.  5) Seatbelt use advised 6) Nutrition, food safety (fish, cheese advisories, and high nitrite foods) and exercise discussed. 7) Hospital and practice style delivering at [facility] discussed  8) Patient is asked about travel to areas at risk for the Zika virus, and counseled to avoid travel and exposure to mosquitoes or sexual partners who may have themselves been exposed to the virus. Testing is discussed, and will be ordered as appropriate.  9) Childbirth classes at [facility] advised 10) Genetic Screening, such as with 1st Trimester Screening, cell free fetal DNA, AFP testing, and Ultrasound, as  well as with amniocentesis and CVS as appropriate, is discussed with patient. She plans to have genetic testing this pregnancy. Interested in first trimester screen 11) Daily headaches, worse from light. Takes Zomig but with no relief. Will refer to neurology for further evaluation.  Problem list reviewed and updated. 12) Anxiety- declines medication. Depression screening with [place] 8/30 13) Pap today 14) Return for formal dating [place], unsure of LMP 15) Was smoking, has stopped completely   Adelene Idler MD [facility], [facility] 02/11/2017  10:52 AM

## 2017-02-12 ENCOUNTER — Encounter: Payer: Self-pay | Admitting: Obstetrics and Gynecology

## 2017-02-12 LAB — RPR+RH+ABO+RUB AB+AB SCR+CB...
Antibody Screen: NEGATIVE
HEP B S AG: NEGATIVE
HIV SCREEN 4TH GENERATION: NONREACTIVE
Hematocrit: 41.7 % (ref 34.0–46.6)
Hemoglobin: 14.1 g/dL (ref 11.1–15.9)
MCH: 31.8 pg (ref 26.6–33.0)
MCHC: 33.8 g/dL (ref 31.5–35.7)
MCV: 94 fL (ref 79–97)
PLATELETS: 297 10*3/uL (ref 150–379)
RBC: 4.43 x10E6/uL (ref 3.77–5.28)
RDW: 13.4 % (ref 12.3–15.4)
RPR Ser Ql: NONREACTIVE
RUBELLA: 7.93 {index} (ref >0.99)
Rh Factor: NEGATIVE
Varicella zoster IgG: 429 index (ref >165)
WBC: 11.7 10*3/uL — ABNORMAL HIGH (ref 3.4–10.8)

## 2017-02-14 LAB — PAPIG, CTNGTV, HPV, RFX 16/18
Chlamydia, Nuc. Acid Amp: NEGATIVE
GONOCOCCUS, NUC. ACID AMP: NEGATIVE
HPV, HIGH-RISK: NEGATIVE
PAP Smear Comment: 0
Trich vag by NAA: NEGATIVE

## 2017-02-17 ENCOUNTER — Other Ambulatory Visit: Payer: Self-pay | Admitting: Obstetrics and Gynecology

## 2017-02-17 DIAGNOSIS — Z3689 Encounter for other specified antenatal screening: Secondary | ICD-10-CM

## 2017-02-17 LAB — URINE CULTURE: ORGANISM ID, BACTERIA: NO GROWTH

## 2017-02-17 LAB — URINE DRUG PANEL 7
AMPHETAMINES, URINE: NEGATIVE ng/mL
Barbiturate Quant, Ur: NEGATIVE ng/mL
Benzodiazepine Quant, Ur: NEGATIVE ng/mL
COCAINE (METAB.): NEGATIVE ng/mL
Cannabinoid Quant, Ur: POSITIVE — AB
OPIATE QUANT UR: NEGATIVE ng/mL
PCP Quant, Ur: NEGATIVE ng/mL

## 2017-02-18 ENCOUNTER — Encounter: Payer: 59 | Admitting: Advanced Practice Midwife

## 2017-02-18 ENCOUNTER — Other Ambulatory Visit: Payer: 59

## 2017-02-18 ENCOUNTER — Telehealth: Payer: Self-pay | Admitting: Obstetrics and Gynecology

## 2017-02-18 NOTE — Progress Notes (Signed)
Discussed results with patient. Encouraged her to stop using marijuana. Patient says that she uses marijuana for nausea after having a reaction to Diclegis. She says she feels marijuana is safer than medication. Discussed that marijuana can be laced with substances and that marijuana can cause pregnancy complications such as IUGR or future issues like schizophrenia. Patient says she tries to minimize her marijuana usage.

## 2017-02-18 NOTE — Telephone Encounter (Signed)
Pt is calling due to missed call from Dr. Jerene PitchSchuman in reguards to her labs. Please advsie

## 2017-02-18 NOTE — Telephone Encounter (Signed)
I discussed these with patient already

## 2017-02-22 ENCOUNTER — Ambulatory Visit (INDEPENDENT_AMBULATORY_CARE_PROVIDER_SITE_OTHER): Payer: 59

## 2017-02-22 ENCOUNTER — Ambulatory Visit (INDEPENDENT_AMBULATORY_CARE_PROVIDER_SITE_OTHER): Payer: 59 | Admitting: Obstetrics & Gynecology

## 2017-02-22 ENCOUNTER — Other Ambulatory Visit: Payer: 59

## 2017-02-22 ENCOUNTER — Ambulatory Visit: Payer: 59

## 2017-02-22 ENCOUNTER — Other Ambulatory Visit: Payer: Self-pay | Admitting: Obstetrics and Gynecology

## 2017-02-22 VITALS — BP 110/60 | Wt 128.0 lb

## 2017-02-22 DIAGNOSIS — Z369 Encounter for antenatal screening, unspecified: Secondary | ICD-10-CM | POA: Diagnosis not present

## 2017-02-22 DIAGNOSIS — Z3689 Encounter for other specified antenatal screening: Secondary | ICD-10-CM

## 2017-02-22 DIAGNOSIS — O0990 Supervision of high risk pregnancy, unspecified, unspecified trimester: Secondary | ICD-10-CM

## 2017-02-22 DIAGNOSIS — Z3A12 12 weeks gestation of pregnancy: Secondary | ICD-10-CM

## 2017-02-22 DIAGNOSIS — Z8632 Personal history of gestational diabetes: Secondary | ICD-10-CM

## 2017-02-22 MED ORDER — PROMETHAZINE HCL 25 MG RE SUPP: 25.0000 mg | Freq: Four times a day (QID) | RECTAL | 0 refills | Status: DC | PRN

## 2017-02-22 NOTE — Progress Notes (Signed)
  Subjective  Fetal Movement? no Contractions? no Leaking Fluid? no Vaginal Bleeding? No Nausea still a problem; does not tolerate Diclegis/Bonjesta (rxn)  Objective  BP 110/60   Wt 128 lb (58.1 kg)   LMP 11/25/2016 (Approximate) Comment: using condoms, ok to do per Dr Para Marchuncan  BMI 21.97 kg/m  General: NAD Pumonary: no increased work of breathing Abdomen: gravid, non-tender Extremities: no edema Psychiatric: mood appropriate, affect full  Assessment  32 y.o. G2P1001 at 1010w5d by  09/01/2017, by Last Menstrual Period presenting for routine prenatal visit  Plan   Problem List Items Addressed This Visit      Other   History of gestational diabetes   Pregnancy, supervision, high-risk, unspecified trimester    Other Visit Diagnoses    [redacted] weeks gestation of pregnancy    -  Primary   Relevant Orders   Other/Misc lab test    Phenergan supp Rx First screen today Declines flu Review of ULTRASOUND.    I have personally reviewed images and report of recent [facility].    Plan of management to be discussed with patient.  Annamarie MajorPaul Harris, MD, Merlinda FrederickFACOG [facility], [facility] 02/22/2017  2:23 PM

## 2017-02-22 NOTE — Patient Instructions (Signed)

## 2017-02-23 LAB — GLUCOSE TOLERANCE, 1 HOUR: Glucose, 1Hr PP: 102 mg/dL (ref 65–199)

## 2017-02-24 LAB — FIRST TRIMESTER SCREEN W/NT
CRL: 51.9 mm
DIA MOM: 1.52
DIA VALUE: 433.4 pg/mL
GEST AGE-COLLECT: 11.9 wk
HCG VALUE: 137.3 [IU]/mL
Maternal Age At EDD: 33.8 yr
NUMBER OF FETUSES: 1
Nuchal Translucency MoM: 2.01
Nuchal Translucency: 2.8 mm
PAPP-A MoM: 0.44
PAPP-A Value: 417.9 ng/mL
TEST RESULTS: POSITIVE — AB
Weight: 128 [lb_av]
hCG MoM: 1.29

## 2017-02-24 NOTE — Progress Notes (Signed)
Have pt come in for lab recheck/will go over with her then (today please)

## 2017-02-24 NOTE — Progress Notes (Signed)
Pt states she could not make it today and scheduled for the 4th

## 2017-02-24 NOTE — Progress Notes (Signed)
Please schedule

## 2017-03-01 ENCOUNTER — Ambulatory Visit (INDEPENDENT_AMBULATORY_CARE_PROVIDER_SITE_OTHER): Payer: 59 | Admitting: Obstetrics & Gynecology

## 2017-03-01 ENCOUNTER — Other Ambulatory Visit: Payer: 59

## 2017-03-01 VITALS — BP 110/70 | Wt 130.0 lb

## 2017-03-01 DIAGNOSIS — O285 Abnormal chromosomal and genetic finding on antenatal screening of mother: Secondary | ICD-10-CM

## 2017-03-01 NOTE — Progress Notes (Signed)
Negative, released to mychart

## 2017-03-01 NOTE — Progress Notes (Signed)
  HPI: Pt has had recent testing for Downs Syndrome which put her at increased risk (1:14 DSR, 1:19 Tri18 risk).  PMHx: She  has a past medical history of Anxiety, Anxiety and depression, Depression, Gestational diabetes, and Migraine without aura. Also,  has a past surgical history that includes Wisdom tooth extraction (2014) and Cesarean section (2016)., family history includes Hypertension in her father; Stroke in her father.,  reports that she has quit smoking. Her smoking use included cigarettes. She has a 6.00 pack-year smoking history. she has never used smokeless tobacco. She reports that she does not drink alcohol or use drugs.  She has a current medication list which includes the following prescription(s): alprazolam, fluoxetine, promethazine, and zolmitriptan. Also, is allergic to benadryl [diphenhydramine hcl (sleep)]; penicillins; and sulfa antibiotics.  Review of Systems  All other systems reviewed and are negative.  Objective: BP 110/70   Wt 130 lb (59 kg)   LMP 11/25/2016 (Approximate) Comment: using condoms, ok to do per Dr Para Marchuncan  BMI 22.31 kg/m   Physical examination Constitutional NAD, Conversant  Skin No rashes, lesions or ulceration.   Extremities: Moves all appropriately.  Normal ROM for age. No lymphadenopathy.  Neuro: Grossly intact  Psych: Oriented to PPT.  Normal mood. Normal affect.   Assessment:  Abnormal screening blood test for Down syndrome in first trimester - Plan: MaterniT21 PLUS Core+SCA Options of CVS, AMnio, and cfDNA testing discussed. Risks of Downs and Trisomy 18 and their implications discussed  A total of 15 minutes were spent face-to-face with the patient during this encounter and over half of that time dealt with counseling and coordination of care.  Annamarie MajorPaul Kalum Minner, MD, Merlinda FrederickFACOG [facility], [facility] 03/01/2017  5:08 PM

## 2017-03-02 ENCOUNTER — Encounter: Payer: Self-pay | Admitting: Obstetrics & Gynecology

## 2017-03-05 ENCOUNTER — Telehealth: Payer: Self-pay | Admitting: Obstetrics & Gynecology

## 2017-03-05 NOTE — Telephone Encounter (Signed)
The test she had takes up to 2 weeks for results to come back; not back yet (MaterniTi)

## 2017-03-05 NOTE — Telephone Encounter (Signed)
Pt is Calling about her lab results. Please advise

## 2017-03-06 ENCOUNTER — Encounter: Payer: Self-pay | Admitting: Obstetrics & Gynecology

## 2017-03-06 LAB — MATERNIT21 PLUS CORE+SCA
CHROMOSOME 18: NEGATIVE
Chromosome 13: NEGATIVE
Chromosome 21: NEGATIVE
Y Chromosome: NOT DETECTED

## 2017-03-06 NOTE — Progress Notes (Signed)
NA, unable to leave message

## 2017-03-08 NOTE — Telephone Encounter (Signed)
Pt calling stating she sees results in her my chart, Pt aware you will call her after you review them

## 2017-03-22 ENCOUNTER — Ambulatory Visit (INDEPENDENT_AMBULATORY_CARE_PROVIDER_SITE_OTHER): Payer: 59 | Admitting: Obstetrics & Gynecology

## 2017-03-22 VITALS — BP 100/60 | Wt 134.0 lb

## 2017-03-22 DIAGNOSIS — Z3A16 16 weeks gestation of pregnancy: Secondary | ICD-10-CM

## 2017-03-22 DIAGNOSIS — O0990 Supervision of high risk pregnancy, unspecified, unspecified trimester: Secondary | ICD-10-CM

## 2017-03-22 MED ORDER — PROMETHAZINE HCL 25 MG RE SUPP: 25.0000 mg | Freq: Four times a day (QID) | RECTAL | 1 refills | Status: DC | PRN

## 2017-03-22 MED ORDER — BUTALBITAL-APAP-CAFFEINE 50-325-40 MG PO CAPS: 1.0000 | ORAL_CAPSULE | Freq: Four times a day (QID) | ORAL | 3 refills | Status: DC | PRN

## 2017-03-22 NOTE — Addendum Note (Signed)
Addended by: Nadara MustardHARRIS, Sulay Brymer P on: 03/22/2017 10:19 AM   Modules accepted: Orders

## 2017-03-22 NOTE — Patient Instructions (Signed)

## 2017-03-22 NOTE — Progress Notes (Signed)
  Subjective  Fetal Movement? no Contractions? no Leaking Fluid? no Vaginal Bleeding? No Still has some days w nausea.  Phenergan helps. Headaches almost daily.  Tylenol no help.  Zomig no help (prior use before preg) Has tried to stop MJ, last use one week ago. Objective  BP 100/60   Wt 134 lb (60.8 kg)   LMP 11/25/2016 (Approximate) Comment: using condoms, ok to do per Dr Para Marchuncan  BMI 23.00 kg/m  General: NAD Pumonary: no increased work of breathing Abdomen: gravid, non-tender Extremities: no edema Psychiatric: mood appropriate, affect full  Assessment  32 y.o. G2P1001 at 2540w5d by  09/01/2017, by Last Menstrual Period presenting for routine prenatal visit  Plan   Problem List Items Addressed This Visit      Other   Pregnancy, supervision, high-risk, unspecified trimester    Other Visit Diagnoses    [redacted] weeks gestation of pregnancy    -  Primary   Relevant Orders   US OB Comp + 14 Wk    Fioicet for ha.  Phenergan for nausea. Stop tob and MJ use.  Annamarie MajorPaul Elener Custodio, MD, Merlinda FrederickFACOG [facility], [facility] 03/22/2017  10:10 AM

## 2017-03-23 ENCOUNTER — Other Ambulatory Visit: Payer: Self-pay | Admitting: Obstetrics & Gynecology

## 2017-03-23 MED ORDER — BUTALBITAL-APAP-CAFFEINE 50-325-40 MG PO TABS: 1.0000 | ORAL_TABLET | ORAL | 2 refills | Status: DC | PRN

## 2017-03-23 MED ORDER — BUTALBITAL-APAP-CAFFEINE 50-325-40 MG PO CAPS: 1.0000 | ORAL_CAPSULE | Freq: Four times a day (QID) | ORAL | 3 refills | Status: DC | PRN

## 2017-04-12 ENCOUNTER — Ambulatory Visit (INDEPENDENT_AMBULATORY_CARE_PROVIDER_SITE_OTHER): Payer: 59 | Admitting: Obstetrics & Gynecology

## 2017-04-12 ENCOUNTER — Ambulatory Visit (INDEPENDENT_AMBULATORY_CARE_PROVIDER_SITE_OTHER): Payer: 59

## 2017-04-12 VITALS — BP 100/60 | Wt 139.0 lb

## 2017-04-12 DIAGNOSIS — Z3A16 16 weeks gestation of pregnancy: Secondary | ICD-10-CM | POA: Diagnosis not present

## 2017-04-12 DIAGNOSIS — O0990 Supervision of high risk pregnancy, unspecified, unspecified trimester: Secondary | ICD-10-CM

## 2017-04-12 DIAGNOSIS — Z3A19 19 weeks gestation of pregnancy: Secondary | ICD-10-CM

## 2017-04-12 NOTE — Progress Notes (Signed)
  Subjective  Fetal Movement? yes Contractions? no Leaking Fluid? no Vaginal Bleeding? no  Objective  BP 100/60   Wt 139 lb (63 kg)   LMP 11/25/2016 (Approximate) Comment: using condoms, ok to do per Dr Para Marchuncan  BMI 23.86 kg/m  General: NAD Pumonary: no increased work of breathing Abdomen: gravid, non-tender Extremities: no edema Psychiatric: mood appropriate, affect full  Assessment  32 y.o. G2P1001 at 2276w5d by  09/01/2017, by Last Menstrual Period presenting for routine prenatal visit  Plan   Problem List Items Addressed This Visit      Other   Pregnancy, supervision, high-risk, unspecified trimester    Other Visit Diagnoses    [redacted] weeks gestation of pregnancy    -  Primary    Review of ULTRASOUND. I have personally reviewed images and report of recent [facility]. There is a singleton gestation with subjectively normal amniotic fluid volume. The fetal biometry correlates with established dating. Detailed evaluation of the fetal anatomy was performed.The fetal anatomical survey appears within normal limits within the resolution of ultrasound as described above.  It must be noted that a normal ultrasound is unable to rule out fetal aneuploidy.   Repeat one month for views not able to be seen today  Annamarie MajorPaul Camber Ninh, MD, Merlinda FrederickFACOG [facility], [facility] 04/12/2017  4:16 PM

## 2017-04-12 NOTE — Patient Instructions (Signed)

## 2017-05-06 ENCOUNTER — Other Ambulatory Visit: Payer: Self-pay | Admitting: Obstetrics & Gynecology

## 2017-05-06 DIAGNOSIS — IMO0002 Reserved for concepts with insufficient information to code with codable children: Secondary | ICD-10-CM

## 2017-05-06 DIAGNOSIS — Z0489 Encounter for examination and observation for other specified reasons: Secondary | ICD-10-CM

## 2017-05-10 ENCOUNTER — Encounter: Payer: 59 | Admitting: Maternal Newborn

## 2017-05-10 ENCOUNTER — Other Ambulatory Visit: Payer: 59

## 2017-05-18 ENCOUNTER — Other Ambulatory Visit: Payer: 59

## 2017-05-18 ENCOUNTER — Encounter: Payer: 59 | Admitting: Certified Nurse Midwife

## 2017-05-31 ENCOUNTER — Encounter: Payer: Self-pay | Admitting: Obstetrics and Gynecology

## 2017-05-31 ENCOUNTER — Ambulatory Visit (INDEPENDENT_AMBULATORY_CARE_PROVIDER_SITE_OTHER): Payer: 59

## 2017-05-31 ENCOUNTER — Ambulatory Visit (INDEPENDENT_AMBULATORY_CARE_PROVIDER_SITE_OTHER): Payer: 59 | Admitting: Obstetrics and Gynecology

## 2017-05-31 VITALS — BP 110/68 | Wt 144.0 lb

## 2017-05-31 DIAGNOSIS — Z3A26 26 weeks gestation of pregnancy: Secondary | ICD-10-CM

## 2017-05-31 DIAGNOSIS — Z0489 Encounter for examination and observation for other specified reasons: Secondary | ICD-10-CM | POA: Diagnosis not present

## 2017-05-31 DIAGNOSIS — F329 Major depressive disorder, single episode, unspecified: Secondary | ICD-10-CM

## 2017-05-31 DIAGNOSIS — Z8632 Personal history of gestational diabetes: Secondary | ICD-10-CM

## 2017-05-31 DIAGNOSIS — O0990 Supervision of high risk pregnancy, unspecified, unspecified trimester: Secondary | ICD-10-CM | POA: Diagnosis not present

## 2017-05-31 DIAGNOSIS — F419 Anxiety disorder, unspecified: Secondary | ICD-10-CM

## 2017-05-31 DIAGNOSIS — IMO0002 Reserved for concepts with insufficient information to code with codable children: Secondary | ICD-10-CM

## 2017-05-31 DIAGNOSIS — K219 Gastro-esophageal reflux disease without esophagitis: Secondary | ICD-10-CM

## 2017-05-31 MED ORDER — RANITIDINE HCL 150 MG PO TABS: 150.0000 mg | ORAL_TABLET | Freq: Every day | ORAL | 11 refills | Status: DC

## 2017-05-31 NOTE — Progress Notes (Signed)
Routine Prenatal Care Visit  Subjective  Alexis Petersen is a 32 y.o. G2P1001 at [redacted]w[redacted]d being seen today for ongoing prenatal care.  She is currently monitored for the following issues for this high-risk pregnancy and has Anxiety and depression; History of gestational diabetes; Migraine without aura; Paresthesia; and Pregnancy, supervision, high-risk, unspecified trimester on their problem list.  ----------------------------------------------------------------------------------- Patient reports gastric reflux.   Contractions: Not present. Vag. Bleeding: None.  Movement: Present. Denies leaking of fluid.  ----------------------------------------------------------------------------------- The following portions of the patient's history were reviewed and updated as appropriate: allergies, current medications, past family history, past medical history, past social history, past surgical history and problem list. Problem list updated.   Objective  Blood pressure 110/68, weight 144 lb (65.3 kg), last menstrual period 11/25/2016. Pregravid weight 130 lb (59 kg) Total Weight Gain 14 lb (6.35 kg) Urinalysis: Urine Protein: Negative Urine Glucose: Negative  Fetal Status: Fetal Heart Rate (bpm): 148 Fundal Height: 24 cm Movement: Present     General:  Alert, oriented and cooperative. Patient is in no acute distress.  Skin: Skin is warm and dry. No rash noted.   Cardiovascular: Normal heart rate noted  Respiratory: Normal respiratory effort, no problems with respiration noted  Abdomen: Soft, gravid, appropriate for gestational age. Pain/Pressure: Absent     Pelvic:  Cervical exam deferred        Extremities: Normal range of motion.     ental Status: Normal mood and affect. Normal behavior. Normal judgment and thought content.     Assessment   32 y.o. G2P1001 at [redacted]w[redacted]d by  09/01/2017, by Last Menstrual Period presenting for routine prenatal visit  Plan   pregnancy#2 Problems (from 11/25/16  to present)    Problem Noted Resolved   Pregnancy, supervision, high-risk, unspecified trimester 02/11/2017 by Natale Milch, MD No   Overview Addendum 05/31/2017  3:31 PM by Natale Milch, MD      [facility] AFP:      Prenatal Labs  Dating  LMP = 11wk [place] Blood type: O/Negative/-- (01/17 1106)   Genetic Screen NIPS:   Normal XX Antibody:Negative (01/17 1106)  Anatomic [place] Complete  Rubella: 7.93 (01/17 1106) Varicella: Immune  GTT Early:        28 wk:      RPR: Non Reactive (01/17 1106)   Rhogam  HBsAg: Negative (01/17 1106)   TDaP vaccine                       HIV: Non Reactive (01/17 1106)   Flu Shot                                GBS:   Contraception  Given information 05/31/17 Pap: NIL (2019)  CBB   Given information 05/31/17   CS/VBAC    Baby Food  Breast   Support Person                 Gestational age appropriate obstetric precautions including but not limited to vaginal bleeding, contractions, leaking of fluid and fetal movement were reviewed in detail with the patient.    Size less than dates by 2cm, if persistent at next visit obtain growth [place] 28 week labs at next visit, ordered today Plantars wart, discussed seeing a dermatologist or podiatrist GERD- sent Zantac prescription to pharmacy Given information on prenatal classes with [facility].  Given information on birthcontrol options  postpartum. Recommended [email or web address].  Given information on cord blood banking. Discussed breast feeding. Patient is planning on breast feeding. Patient was given resources and lactation consultation was advised.   Return in about 11 days (around 06/11/2017) for 1hr GTT and ROB.  Adelene Idler MD [facility], [facility] 05/31/17 3:27 PM

## 2017-05-31 NOTE — Progress Notes (Signed)
No vb. No lof.  

## 2017-06-14 ENCOUNTER — Ambulatory Visit (INDEPENDENT_AMBULATORY_CARE_PROVIDER_SITE_OTHER): Payer: 59 | Admitting: Obstetrics & Gynecology

## 2017-06-14 ENCOUNTER — Other Ambulatory Visit: Payer: 59

## 2017-06-14 VITALS — BP 120/60 | Wt 150.0 lb

## 2017-06-14 DIAGNOSIS — O0990 Supervision of high risk pregnancy, unspecified, unspecified trimester: Secondary | ICD-10-CM

## 2017-06-14 DIAGNOSIS — O0993 Supervision of high risk pregnancy, unspecified, third trimester: Secondary | ICD-10-CM | POA: Diagnosis not present

## 2017-06-14 DIAGNOSIS — Z3A28 28 weeks gestation of pregnancy: Secondary | ICD-10-CM

## 2017-06-14 MED ORDER — RHO D IMMUNE GLOBULIN 1500 UNITS IM SOSY: 1500.0000 [IU] | PREFILLED_SYRINGE | Freq: Once | INTRAMUSCULAR | Status: DC

## 2017-06-14 MED ORDER — RHO D IMMUNE GLOBULIN 1500 UNIT/2ML IJ SOSY
300.0000 ug | PREFILLED_SYRINGE | Freq: Once | INTRAMUSCULAR | Status: AC
  Administered 2017-06-14: 300 ug via INTRAMUSCULAR

## 2017-06-14 NOTE — Addendum Note (Signed)
Addended by: Cornelius Moras D on: 06/14/2017 04:08 PM   Modules accepted: Orders

## 2017-06-14 NOTE — Patient Instructions (Signed)
Third Trimester of Pregnancy The third trimester is from week 28 through week 40 (months 7 through 9). The third trimester is a time when the unborn baby (fetus) is growing rapidly. At the end of the ninth month, the fetus is about 20 inches in length and weighs 6-10 pounds. Body changes during your third trimester Your body will continue to go through many changes during pregnancy. The changes vary from woman to woman. During the third trimester:  Your weight will continue to increase. You can expect to gain 25-35 pounds (11-16 kg) by the end of the pregnancy.  You may begin to get stretch marks on your hips, abdomen, and breasts.  You may urinate more often because the fetus is moving lower into your pelvis and pressing on your bladder.  You may develop or continue to have heartburn. This is caused by increased hormones that slow down muscles in the digestive tract.  You may develop or continue to have constipation because increased hormones slow digestion and cause the muscles that push waste through your intestines to relax.  You may develop hemorrhoids. These are swollen veins (varicose veins) in the rectum that can itch or be painful.  You may develop swollen, bulging veins (varicose veins) in your legs.  You may have increased body aches in the pelvis, back, or thighs. This is due to weight gain and increased hormones that are relaxing your joints.  You may have changes in your hair. These can include thickening of your hair, rapid growth, and changes in texture. Some women also have hair loss during or after pregnancy, or hair that feels dry or thin. Your hair will most likely return to normal after your baby is born.  Your breasts will continue to grow and they will continue to become tender. A yellow fluid (colostrum) may leak from your breasts. This is the first milk you are producing for your baby.  Your belly button may stick out.  You may notice more swelling in your hands,  face, or ankles.  You may have increased tingling or numbness in your hands, arms, and legs. The skin on your belly may also feel numb.  You may feel short of breath because of your expanding uterus.  You may have more problems sleeping. This can be caused by the size of your belly, increased need to urinate, and an increase in your body's metabolism.  You may notice the fetus "dropping," or moving lower in your abdomen (lightening).  You may have increased vaginal discharge.  You may notice your joints feel loose and you may have pain around your pelvic bone.  What to expect at prenatal visits You will have prenatal exams every 2 weeks until week 36. Then you will have weekly prenatal exams. During a routine prenatal visit:  You will be weighed to make sure you and the baby are growing normally.  Your blood pressure will be taken.  Your abdomen will be measured to track your baby's growth.  The fetal heartbeat will be listened to.  Any test results from the previous visit will be discussed.  You may have a cervical check near your due date to see if your cervix has softened or thinned (effaced).  You will be tested for Group B streptococcus. This happens between 35 and 37 weeks.  Your health care provider may ask you:  What your birth plan is.  How you are feeling.  If you are feeling the baby move.  If you have had   any abnormal symptoms, such as leaking fluid, bleeding, severe headaches, or abdominal cramping.  If you are using any tobacco products, including cigarettes, chewing tobacco, and electronic cigarettes.  If you have any questions.  Other tests or screenings that may be performed during your third trimester include:  Blood tests that check for low iron levels (anemia).  Fetal testing to check the health, activity level, and growth of the fetus. Testing is done if you have certain medical conditions or if there are problems during the  pregnancy.  Nonstress test (NST). This test checks the health of your baby to make sure there are no signs of problems, such as the baby not getting enough oxygen. During this test, a belt is placed around your belly. The baby is made to move, and its heart rate is monitored during movement.  What is false labor? False labor is a condition in which you feel small, irregular tightenings of the muscles in the womb (contractions) that usually go away with rest, changing position, or drinking water. These are called Braxton Hicks contractions. Contractions may last for hours, days, or even weeks before true labor sets in. If contractions come at regular intervals, become more frequent, increase in intensity, or become painful, you should see your health care provider. What are the signs of labor?  Abdominal cramps.  Regular contractions that start at 10 minutes apart and become stronger and more frequent with time.  Contractions that start on the top of the uterus and spread down to the lower abdomen and back.  Increased pelvic pressure and dull back pain.  A watery or bloody mucus discharge that comes from the vagina.  Leaking of amniotic fluid. This is also known as your "water breaking." It could be a slow trickle or a gush. Let your health care provider know if it has a color or strange odor. If you have any of these signs, call your health care provider right away, even if it is before your due date. Follow these instructions at home: Medicines  Follow your health care provider's instructions regarding medicine use. Specific medicines may be either safe or unsafe to take during pregnancy.  Take a prenatal vitamin that contains at least 600 micrograms (mcg) of folic acid.  If you develop constipation, try taking a stool softener if your health care provider approves. Eating and drinking  Eat a balanced diet that includes fresh fruits and vegetables, whole grains, good sources of protein  such as meat, eggs, or tofu, and low-fat dairy. Your health care provider will help you determine the amount of weight gain that is right for you.  Avoid raw meat and uncooked cheese. These carry germs that can cause birth defects in the baby.  If you have low calcium intake from food, talk to your health care provider about whether you should take a daily calcium supplement.  Eat four or five small meals rather than three large meals a day.  Limit foods that are high in fat and processed sugars, such as fried and sweet foods.  To prevent constipation: ? Drink enough fluid to keep your urine clear or pale yellow. ? Eat foods that are high in fiber, such as fresh fruits and vegetables, whole grains, and beans. Activity  Exercise only as directed by your health care provider. Most women can continue their usual exercise routine during pregnancy. Try to exercise for 30 minutes at least 5 days a week. Stop exercising if you experience uterine contractions.  Avoid heavy   lifting.  Do not exercise in extreme heat or humidity, or at high altitudes.  Wear low-heel, comfortable shoes.  Practice good posture.  You may continue to have sex unless your health care provider tells you otherwise. Relieving pain and discomfort  Take frequent breaks and rest with your legs elevated if you have leg cramps or low back pain.  Take warm sitz baths to soothe any pain or discomfort caused by hemorrhoids. Use hemorrhoid cream if your health care provider approves.  Wear a good support bra to prevent discomfort from breast tenderness.  If you develop varicose veins: ? Wear support pantyhose or compression stockings as told by your healthcare provider. ? Elevate your feet for 15 minutes, 3-4 times a day. Prenatal care  Write down your questions. Take them to your prenatal visits.  Keep all your prenatal visits as told by your health care provider. This is important. Safety  Wear your seat belt at  all times when driving.  Make a list of emergency phone numbers, including numbers for family, friends, the hospital, and police and fire departments. General instructions  Avoid cat litter boxes and soil used by cats. These carry germs that can cause birth defects in the baby. If you have a cat, ask someone to clean the litter box for you.  Do not travel far distances unless it is absolutely necessary and only with the approval of your health care provider.  Do not use hot tubs, steam rooms, or saunas.  Do not drink alcohol.  Do not use any products that contain nicotine or tobacco, such as cigarettes and e-cigarettes. If you need help quitting, ask your health care provider.  Do not use any medicinal herbs or unprescribed drugs. These chemicals affect the formation and growth of the baby.  Do not douche or use tampons or scented sanitary pads.  Do not cross your legs for long periods of time.  To prepare for the arrival of your baby: ? Take prenatal classes to understand, practice, and ask questions about labor and delivery. ? Make a trial run to the hospital. ? Visit the hospital and tour the maternity area. ? Arrange for maternity or paternity leave through employers. ? Arrange for family and friends to take care of pets while you are in the hospital. ? Purchase a rear-facing car seat and make sure you know how to install it in your car. ? Pack your hospital bag. ? Prepare the baby's nursery. Make sure to remove all pillows and stuffed animals from the baby's crib to prevent suffocation.  Visit your dentist if you have not gone during your pregnancy. Use a soft toothbrush to brush your teeth and be gentle when you floss. Contact a health care provider if:  You are unsure if you are in labor or if your water has broken.  You become dizzy.  You have mild pelvic cramps, pelvic pressure, or nagging pain in your abdominal area.  You have lower back pain.  You have persistent  nausea, vomiting, or diarrhea.  You have an unusual or bad smelling vaginal discharge.  You have pain when you urinate. Get help right away if:  Your water breaks before 37 weeks.  You have regular contractions less than 5 minutes apart before 37 weeks.  You have a fever.  You are leaking fluid from your vagina.  You have spotting or bleeding from your vagina.  You have severe abdominal pain or cramping.  You have rapid weight loss or weight gain.    You have shortness of breath with chest pain.  You notice sudden or extreme swelling of your face, hands, ankles, feet, or legs.  Your baby makes fewer than 10 movements in 2 hours.  You have severe headaches that do not go away when you take medicine.  You have vision changes. Summary  The third trimester is from week 28 through week 40, months 7 through 9. The third trimester is a time when the unborn baby (fetus) is growing rapidly.  During the third trimester, your discomfort may increase as you and your baby continue to gain weight. You may have abdominal, leg, and back pain, sleeping problems, and an increased need to urinate.  During the third trimester your breasts will keep growing and they will continue to become tender. A yellow fluid (colostrum) may leak from your breasts. This is the first milk you are producing for your baby.  False labor is a condition in which you feel small, irregular tightenings of the muscles in the womb (contractions) that eventually go away. These are called Braxton Hicks contractions. Contractions may last for hours, days, or even weeks before true labor sets in.  Signs of labor can include: abdominal cramps; regular contractions that start at 10 minutes apart and become stronger and more frequent with time; watery or bloody mucus discharge that comes from the vagina; increased pelvic pressure and dull back pain; and leaking of amniotic fluid. This information is not intended to replace advice  given to you by your health care provider. Make sure you discuss any questions you have with your health care provider. Document Released: 01/06/2001 Document Revised: 06/20/2015 Document Reviewed: 03/15/2012 Elsevier Interactive Patient Education  2017 [employer]  

## 2017-06-14 NOTE — Progress Notes (Signed)
  Subjective  Fetal Movement? yes Contractions? no Leaking Fluid? no Vaginal Bleeding? no  Objective  BP 120/60   Wt 150 lb (68 kg)   LMP 11/25/2016 (Approximate) Comment: using condoms, ok to do per Dr Para March  BMI 25.75 kg/m  General: NAD Pumonary: no increased work of breathing Abdomen: gravid, non-tender Extremities: no edema Psychiatric: mood appropriate, affect full  Assessment  32 y.o. G2P1001 at [redacted]w[redacted]d by  09/01/2017, by Last Menstrual Period presenting for routine prenatal visit  Plan   Problem List Items Addressed This Visit      Other   Pregnancy, supervision, high-risk, unspecified trimester    Other Visit Diagnoses    [redacted] weeks gestation of pregnancy    -  Primary    Labs today Growth normal.  Prior 5lb13oz full term infant.  Alexis Major, MD, [facility], [facility] 06/14/2017  3:10 PM

## 2017-06-15 LAB — 28 WEEKS RH-PANEL
Antibody Screen: NEGATIVE
BASOS: 0 %
Basophils Absolute: 0 10*3/uL (ref 0.0–0.2)
EOS (ABSOLUTE): 0.1 10*3/uL (ref 0.0–0.4)
Eos: 1 %
GESTATIONAL DIABETES SCREEN: 127 mg/dL (ref 65–139)
HEMATOCRIT: 32 % — AB (ref 34.0–46.6)
HEMOGLOBIN: 10.9 g/dL — AB (ref 11.1–15.9)
HIV Screen 4th Generation wRfx: NONREACTIVE
Immature Grans (Abs): 0 10*3/uL (ref 0.0–0.1)
Immature Granulocytes: 0 %
LYMPHS ABS: 2.2 10*3/uL (ref 0.7–3.1)
Lymphs: 17 %
MCH: 32.6 pg (ref 26.6–33.0)
MCHC: 34.1 g/dL (ref 31.5–35.7)
MCV: 96 fL (ref 79–97)
MONOS ABS: 0.6 10*3/uL (ref 0.1–0.9)
Monocytes: 5 %
Neutrophils Absolute: 10.3 10*3/uL — ABNORMAL HIGH (ref 1.4–7.0)
Neutrophils: 77 %
Platelets: 269 10*3/uL (ref 150–450)
RBC: 3.34 x10E6/uL — AB (ref 3.77–5.28)
RDW: 13.4 % (ref 12.3–15.4)
RPR: NONREACTIVE
WBC: 13.3 10*3/uL — AB (ref 3.4–10.8)

## 2017-06-28 ENCOUNTER — Encounter: Payer: 59 | Admitting: Obstetrics and Gynecology

## 2017-07-01 ENCOUNTER — Encounter: Payer: Self-pay | Admitting: Obstetrics and Gynecology

## 2017-07-01 ENCOUNTER — Ambulatory Visit (INDEPENDENT_AMBULATORY_CARE_PROVIDER_SITE_OTHER): Payer: 59 | Admitting: Obstetrics and Gynecology

## 2017-07-01 VITALS — BP 100/60 | Wt 151.0 lb

## 2017-07-01 DIAGNOSIS — F32A Depression, unspecified: Secondary | ICD-10-CM

## 2017-07-01 DIAGNOSIS — Z23 Encounter for immunization: Secondary | ICD-10-CM

## 2017-07-01 DIAGNOSIS — Z3A31 31 weeks gestation of pregnancy: Secondary | ICD-10-CM

## 2017-07-01 DIAGNOSIS — F419 Anxiety disorder, unspecified: Secondary | ICD-10-CM

## 2017-07-01 DIAGNOSIS — F329 Major depressive disorder, single episode, unspecified: Secondary | ICD-10-CM

## 2017-07-01 DIAGNOSIS — L2082 Flexural eczema: Secondary | ICD-10-CM

## 2017-07-01 DIAGNOSIS — O99013 Anemia complicating pregnancy, third trimester: Secondary | ICD-10-CM

## 2017-07-01 DIAGNOSIS — O0990 Supervision of high risk pregnancy, unspecified, unspecified trimester: Secondary | ICD-10-CM

## 2017-07-01 DIAGNOSIS — O26843 Uterine size-date discrepancy, third trimester: Secondary | ICD-10-CM

## 2017-07-01 MED ORDER — TETANUS-DIPHTH-ACELL PERTUSSIS 5-2.5-18.5 LF-MCG/0.5 IM SUSP
0.5000 mL | Freq: Once | INTRAMUSCULAR | Status: AC
  Administered 2017-07-01: 0.5 mL via INTRAMUSCULAR

## 2017-07-01 MED ORDER — FERROUS SULFATE 325 (65 FE) MG PO TABS: 325.0000 mg | ORAL_TABLET | Freq: Two times a day (BID) | ORAL | 11 refills | Status: DC

## 2017-07-01 NOTE — Addendum Note (Signed)
Addended by: Loran SentersJOHNSON, Dorianna Mckiver D on: 07/01/2017 11:37 AM   Modules accepted: Orders

## 2017-07-01 NOTE — Progress Notes (Signed)
C/o please see left elbow - area in question is getting bigger c preg (pt concerned b/c F-I-L has melanoma).rj

## 2017-07-01 NOTE — Progress Notes (Signed)
    Routine Prenatal Care Visit  Subjective  Alexis Petersen is a 32 y.o. G2P1001 at 6148w1d being seen today for ongoing prenatal care.  She is currently monitored for the following issues for this high-risk pregnancy and has Anxiety and depression; History of gestational diabetes; Migraine without aura; Paresthesia; and Pregnancy, supervision, high-risk, unspecified trimester on their problem list.  ----------------------------------------------------------------------------------- Patient reports no complaints.   Contractions: Not present. Vag. Bleeding: None.  Movement: Present. Denies leaking of fluid.  ----------------------------------------------------------------------------------- The following portions of the patient's history were reviewed and updated as appropriate: allergies, current medications, past family history, past medical history, past social history, past surgical history and problem list. Problem list updated.   Objective  Blood pressure 100/60, weight 151 lb (68.5 kg), last menstrual period 11/25/2016. Pregravid weight 130 lb (59 kg) Total Weight Gain 21 lb (9.526 kg) Urinalysis:      Fetal Status: Fetal Heart Rate (bpm): 150 Fundal Height: 28 cm Movement: Present     General:  Alert, oriented and cooperative. Patient is in no acute distress.  Skin: Skin is warm and dry. No rash noted.   Cardiovascular: Normal heart rate noted  Respiratory: Normal respiratory effort, no problems with respiration noted  Abdomen: Soft, gravid, appropriate for gestational age. Pain/Pressure: Absent     Pelvic:  Cervical exam deferred        Extremities: Normal range of motion.     ental Status: Normal mood and affect. Normal behavior. Normal judgment and thought content.     Assessment   32 y.o. G2P1001 at 3148w1d by  09/01/2017, by Last Menstrual Period presenting for routine prenatal visit  Plan   pregnancy#2 Problems (from 11/25/16 to present)    Problem Noted Resolved   Pregnancy, supervision, high-risk, unspecified trimester 02/11/2017 by Natale MilchSchuman, Kayler Buckholtz R, MD No   Overview Addendum 07/01/2017 11:26 AM by Natale MilchSchuman, Chrisoula Zegarra R, MD      [facility]inic [facility]  Prenatal Labs  Dating  LMP = 11wk US Blood type: O/Negative/-- (01/17 1106)   Genetic Screen NIPS:   Normal XX Antibody:Negative (01/17 1106)  Anatomic US Complete  Rubella: 7.93 (01/17 1106) Varicella: Immune  GTT Early:        28 wk:127      RPR: Non Reactive (01/17 1106)   Rhogam 06/14/17 HBsAg: Negative (01/17 1106)   TDaP vaccine  07/01/17                     HIV: Non Reactive (01/17 1106)   Flu Shot                                GBS:   Contraception  Given information 05/31/17 Pap: NIL (2019)  CBB   Given information 05/31/17   CS/VBAC    Baby Food  Breast   Support Person                 Gestational age appropriate obstetric precautions including but not limited to vaginal bleeding, contractions, leaking of fluid and fetal movement were reviewed in detail with the patient.    Uterine size less than date, growth US at next visit. Tdap today Iron prescription sent to pharmacy Referral for eczema and planta warts to dermatology.   Return in about 2 weeks (around 07/15/2017) for ROB.  Adelene Idlerhristanna Claretha Townshend MD [facility], [facility] 07/01/17 11:28 AM

## 2017-07-15 ENCOUNTER — Ambulatory Visit (INDEPENDENT_AMBULATORY_CARE_PROVIDER_SITE_OTHER): Payer: 59 | Admitting: Certified Nurse Midwife

## 2017-07-15 ENCOUNTER — Encounter: Payer: Self-pay | Admitting: Certified Nurse Midwife

## 2017-07-15 ENCOUNTER — Encounter: Payer: 59 | Admitting: Certified Nurse Midwife

## 2017-07-15 ENCOUNTER — Ambulatory Visit (INDEPENDENT_AMBULATORY_CARE_PROVIDER_SITE_OTHER): Payer: 59

## 2017-07-15 VITALS — BP 100/60 | Wt 151.0 lb

## 2017-07-15 DIAGNOSIS — Z3A33 33 weeks gestation of pregnancy: Secondary | ICD-10-CM

## 2017-07-15 DIAGNOSIS — O26843 Uterine size-date discrepancy, third trimester: Secondary | ICD-10-CM | POA: Diagnosis not present

## 2017-07-15 DIAGNOSIS — O34219 Maternal care for unspecified type scar from previous cesarean delivery: Secondary | ICD-10-CM

## 2017-07-15 DIAGNOSIS — O0990 Supervision of high risk pregnancy, unspecified, unspecified trimester: Secondary | ICD-10-CM

## 2017-07-15 NOTE — Progress Notes (Signed)
Pt reports no problems. Growth u/s today.

## 2017-07-22 ENCOUNTER — Encounter: Payer: Self-pay | Admitting: Certified Nurse Midwife

## 2017-07-22 DIAGNOSIS — O34219 Maternal care for unspecified type scar from previous cesarean delivery: Secondary | ICD-10-CM | POA: Insufficient documentation

## 2017-07-22 NOTE — Progress Notes (Signed)
ROB at 33wk1day/ growth scan today. Hx of LTCS for FITL with G1. Discussed with patient delivery planning. Discussed pros and cons of TOLAC and repeat CS. Explained risks of uterine rupture with spontaneous labor at <1% and with induction of labor that risk increases. Also discussed risks of repeat CS including bleeding, infection, injury to other organs and blood vessels in abdomen, and the risks of labor.  She wants to see if she will go into labor on her own, but if not in labor by around her due date-wishes to have a repeat CS.  Scheduled for CS on 8 August with Dr Jean RosenthalJackson. Growth scan today: CGA 32 wk4d with EFW 4#7oz (36.8%), AFI 11.66, cephalic presentation Wishes to breast feed Alexis Petersen, CNM

## 2017-07-23 ENCOUNTER — Telehealth: Payer: Self-pay | Admitting: Obstetrics and Gynecology

## 2017-07-23 NOTE — Telephone Encounter (Signed)
Lmtrc

## 2017-07-23 NOTE — Telephone Encounter (Signed)
Patient is calling to return [place] to [place] . Please advise

## 2017-07-23 NOTE — Telephone Encounter (Signed)
-----   Message from Farrel Connersolleen Gutierrez, [place] sent at 07/22/2017  7:04 PM EDT ----- Regarding: Schedule for surgery Surgery Booking Request Patient Full Name:  Alexis Petersen  MRN: [id number]  DOB: [birth year]  Surgeon: Dr Jean RosenthalJackson Requested Surgery Date and Time: 8 August Primary Diagnosis AND Code: Previous Cesarean section Secondary Diagnosis and Code: Desires repeat Cesarean section Surgical Procedure: Cesarean Section L&D Notification: Yes Admission Status: surgery admit Length of Surgery: 1 hour Special Case Needs: no H&P: pending Phone Interview???: no Interpreter: Language: English Medical Clearance: no Special Scheduling Instructions: no

## 2017-07-23 NOTE — Telephone Encounter (Signed)
[facility] 8:50am w/ Dr. Jean RosenthalJackson, Pre-admit Testing afterwards, and OR on 09/02/17. Ext given.

## 2017-07-30 ENCOUNTER — Encounter: Payer: Self-pay | Admitting: Obstetrics and Gynecology

## 2017-07-30 ENCOUNTER — Ambulatory Visit (INDEPENDENT_AMBULATORY_CARE_PROVIDER_SITE_OTHER): Payer: 59 | Admitting: Obstetrics and Gynecology

## 2017-07-30 VITALS — BP 102/58 | Wt 162.5 lb

## 2017-07-30 DIAGNOSIS — Z8632 Personal history of gestational diabetes: Secondary | ICD-10-CM

## 2017-07-30 DIAGNOSIS — Z3A35 35 weeks gestation of pregnancy: Secondary | ICD-10-CM

## 2017-07-30 DIAGNOSIS — O34219 Maternal care for unspecified type scar from previous cesarean delivery: Secondary | ICD-10-CM

## 2017-07-30 DIAGNOSIS — O0993 Supervision of high risk pregnancy, unspecified, third trimester: Secondary | ICD-10-CM

## 2017-07-30 NOTE — Progress Notes (Incomplete)
  Routine Prenatal Care Visit  Subjective  Alexis Petersen is a 32 y.o. G2P1001 at 6768w2d being seen today for ongoing prenatal care.  She is currently monitored for the following issues for this {Blank single:19197::"high-risk","low-risk"} pregnancy and has Anxiety and depression; History of gestational diabetes; Migraine without aura; Paresthesia; Pregnancy, supervision, high-risk, unspecified trimester; and Previous cesarean section complicating pregnancy on their problem list.  ----------------------------------------------------------------------------------- Patient reports {sx:14538}.   Contractions: Not present. Vag. Bleeding: None.  Movement: Present. Denies leaking of fluid.  ----------------------------------------------------------------------------------- The following portions of the patient's history were reviewed and updated as appropriate: allergies, current medications, past family history, past medical history, past social history, past surgical history and problem list. Problem list updated.   Objective  Blood pressure (!) 102/58, weight 162 lb 8 oz (73.7 kg), last menstrual period 11/25/2016. Pregravid weight 130 lb (59 kg) Total Weight Gain 32 lb 8 oz (14.7 kg) Urinalysis:      Fetal Status: Fetal Heart Rate (bpm): 145 Fundal Height: 35 cm Movement: Present     General:  Alert, oriented and cooperative. Patient is in no acute distress.  Skin: Skin is warm and dry. No rash noted.   Cardiovascular: Normal heart rate noted  Respiratory: Normal respiratory effort, no problems with respiration noted  Abdomen: Soft, gravid, appropriate for gestational age. Pain/Pressure: Absent     Pelvic:  {Blank single:19197::"Cervical exam performed","Cervical exam deferred"}        Extremities: Normal range of motion.     Mental Status: Normal mood and affect. Normal behavior. Normal judgment and thought content.   Assessment   32 y.o. G2P1001 at 2668w2d by  09/01/2017, by Last  Menstrual Period presenting for {Blank single:19197::"routine","work-in"} prenatal visit  Plan   pregnancy#2 Problems (from 11/25/16 to present)    Problem Noted Resolved   Pregnancy, supervision, high-risk, unspecified trimester 02/11/2017 by Natale MilchSchuman, Christanna R, MD No   Overview Addendum 07/01/2017 11:26 AM by Natale MilchSchuman, Christanna R, MD      [facility]inic [facility]  Prenatal Labs  Dating  LMP = 11wk US Blood type: O/Negative/-- (01/17 1106)   Genetic Screen NIPS:   Normal XX Antibody:Negative (01/17 1106)  Anatomic US Complete  Rubella: 7.93 (01/17 1106) Varicella: Immune  GTT Early:        28 wk:127      RPR: Non Reactive (01/17 1106)   Rhogam 06/14/17 HBsAg: Negative (01/17 1106)   TDaP vaccine  07/01/17                     HIV: Non Reactive (01/17 1106)   Flu Shot                                GBS:   Contraception  Given information 05/31/17 Pap: NIL (2019)  CBB   Given information 05/31/17   CS/VBAC    Baby Food  Breast   Support Person                 {Blank single:19197::"Term","Preterm"} labor symptoms and general obstetric precautions including but not limited to vaginal bleeding, contractions, leaking of fluid and fetal movement were reviewed in detail with the patient. Please refer to After Visit Summary for other counseling recommendations.   Return in about 1 week (around 08/06/2017) for Routine Prenatal Appointment.  Thomasene MohairStephen Vail Basista, MD, Merlinda FrederickFACOG [facility], [facility] 07/30/2017 4:39 PM

## 2017-07-30 NOTE — Progress Notes (Signed)
Routine Prenatal Care Visit  Subjective  Alexis Petersen is a 32 y.o. G2P1001 at [redacted]w[redacted]d being seen today for ongoing prenatal care.  She is currently monitored for the following issues for this high-risk pregnancy and has Anxiety and depression; History of gestational diabetes; Migraine without aura; Paresthesia; Pregnancy, supervision, high-risk, unspecified trimester; and Previous cesarean section complicating pregnancy on their problem list.  ----------------------------------------------------------------------------------- Patient reports no complaints.   Contractions: Not present. Vag. Bleeding: None.  Movement: Present. Denies leaking of fluid.  ----------------------------------------------------------------------------------- The following portions of the patient's history were reviewed and updated as appropriate: allergies, current medications, past family history, past medical history, past social history, past surgical history and problem list. Problem list updated.   Objective  Blood pressure (!) 102/58, weight 162 lb 8 oz (73.7 kg), last menstrual period 11/25/2016. Pregravid weight 130 lb (59 kg) Total Weight Gain 32 lb 8 oz (14.7 kg) Urinalysis:      Fetal Status: Fetal Heart Rate (bpm): 145 Fundal Height: 35 cm Movement: Present     General:  Alert, oriented and cooperative. Patient is in no acute distress.  Skin: Skin is warm and dry. No rash noted.   Cardiovascular: Normal heart rate noted  Respiratory: Normal respiratory effort, no problems with respiration noted  Abdomen: Soft, gravid, appropriate for gestational age. Pain/Pressure: Absent     Pelvic:  Cervical exam deferred        Extremities: Normal range of motion.     Mental Status: Normal mood and affect. Normal behavior. Normal judgment and thought content.   Assessment   32 y.o. G2P1001 at [redacted]w[redacted]d by  09/01/2017, by Last Menstrual Period presenting for routine prenatal visit  Plan   pregnancy#2 Problems  (from 11/25/16 to present)    Problem Noted Resolved   Pregnancy, supervision, high-risk, unspecified trimester 02/11/2017 by Alexis Milch, MD No   Overview Addendum 07/01/2017 11:26 AM by Alexis Milch, MD      [facility]  Prenatal Labs  Dating  LMP = 11wk [place] Blood type: O/Negative/-- (01/17 1106)   Genetic Screen NIPS:   Normal XX Antibody:Negative (01/17 1106)  Anatomic [place] Complete  Rubella: 7.93 (01/17 1106) Varicella: Immune  GTT Early:        28 wk:127      RPR: Non Reactive (01/17 1106)   Rhogam 06/14/17 HBsAg: Negative (01/17 1106)   TDaP vaccine  07/01/17                     HIV: Non Reactive (01/17 1106)   Flu Shot                                GBS:   Contraception  Given information 05/31/17 Pap: NIL (2019)  CBB   Given information 05/31/17   CS/VBAC    Baby Food  Breast   Support Person                 Preterm labor symptoms and general obstetric precautions including but not limited to vaginal bleeding, contractions, leaking of fluid and fetal movement were reviewed in detail with the patient. Please refer to After Visit Summary for other counseling recommendations.   - discussed TOLAC vs C-section in great detail. She was given the [facility] information sheet with risks/benefits noted.  All questions answered for today.  - GBS next visit  Return in about 1 week (around 08/06/2017) for Routine Prenatal  Appointment.  Alexis MohairStephen Arlethia Basso, MD, Merlinda FrederickFACOG [facility], [facility] 07/30/2017 4:39 PM

## 2017-08-06 ENCOUNTER — Ambulatory Visit (INDEPENDENT_AMBULATORY_CARE_PROVIDER_SITE_OTHER): Payer: 59 | Admitting: Obstetrics and Gynecology

## 2017-08-06 VITALS — BP 106/70 | Wt 160.0 lb

## 2017-08-06 DIAGNOSIS — O09293 Supervision of pregnancy with other poor reproductive or obstetric history, third trimester: Secondary | ICD-10-CM

## 2017-08-06 DIAGNOSIS — F329 Major depressive disorder, single episode, unspecified: Secondary | ICD-10-CM

## 2017-08-06 DIAGNOSIS — Z8632 Personal history of gestational diabetes: Secondary | ICD-10-CM

## 2017-08-06 DIAGNOSIS — O0990 Supervision of high risk pregnancy, unspecified, unspecified trimester: Secondary | ICD-10-CM

## 2017-08-06 DIAGNOSIS — Z3685 Encounter for antenatal screening for Streptococcus B: Secondary | ICD-10-CM

## 2017-08-06 DIAGNOSIS — Z3A36 36 weeks gestation of pregnancy: Secondary | ICD-10-CM

## 2017-08-06 DIAGNOSIS — O34219 Maternal care for unspecified type scar from previous cesarean delivery: Secondary | ICD-10-CM

## 2017-08-06 DIAGNOSIS — F418 Other specified anxiety disorders: Secondary | ICD-10-CM

## 2017-08-06 DIAGNOSIS — F419 Anxiety disorder, unspecified: Secondary | ICD-10-CM

## 2017-08-06 DIAGNOSIS — O99343 Other mental disorders complicating pregnancy, third trimester: Secondary | ICD-10-CM

## 2017-08-06 NOTE — Progress Notes (Signed)
    Routine Prenatal Care Visit  Subjective  Alexis Petersen is a 32 y.o. G2P1001 at 5961w3d being seen today for ongoing prenatal care.  She is currently monitored for the following issues for this high-risk pregnancy and has Anxiety and depression; History of gestational diabetes; Migraine without aura; Paresthesia; Pregnancy, supervision, high-risk, unspecified trimester; and Previous cesarean section complicating pregnancy on their problem list.  ----------------------------------------------------------------------------------- Patient reports no complaints.   Contractions: Irregular. Vag. Bleeding: None.  Movement: Present. Denies leaking of fluid.  ----------------------------------------------------------------------------------- The following portions of the patient's history were reviewed and updated as appropriate: allergies, current medications, past family history, past medical history, past social history, past surgical history and problem list. Problem list updated.   Objective  Blood pressure 106/70, weight 160 lb (72.6 kg), last menstrual period 11/25/2016. Pregravid weight 130 lb (59 kg) Total Weight Gain 30 lb (13.6 kg) Urinalysis: Urine Protein: Negative Urine Glucose: Trace  Fetal Status: Fetal Heart Rate (bpm): 135 Fundal Height: 36 cm Movement: Present  Presentation: Vertex  General:  Alert, oriented and cooperative. Patient is in no acute distress.  Skin: Skin is warm and dry. No rash noted.   Cardiovascular: Normal heart rate noted  Respiratory: Normal respiratory effort, no problems with respiration noted  Abdomen: Soft, gravid, appropriate for gestational age. Pain/Pressure: Present     Pelvic:  Cervical exam performed Dilation: Closed     Closed narrow arch prominenet ishial spines  Extremities: Normal range of motion.     ental Status: Normal mood and affect. Normal behavior. Normal judgment and thought content.     Assessment   32 y.o. G2P1001 at 4361w3d  by  09/01/2017, by Last Menstrual Period presenting for routine prenatal visit  Plan   pregnancy#2 Problems (from 11/25/16 to present)    Problem Noted Resolved   Pregnancy, supervision, high-risk, unspecified trimester 02/11/2017 by Natale MilchSchuman, Christanna R, MD No   Overview Addendum 07/01/2017 11:26 AM by Natale MilchSchuman, Christanna R, MD      [facility]  Prenatal Labs  Dating  LMP = 11wk US Blood type: O/Negative/-- (01/17 1106)   Genetic Screen NIPS:   Normal XX Antibody:Negative (01/17 1106)  Anatomic US Complete  Rubella: 7.93 (01/17 1106) Varicella: Immune  GTT Early:        28 wk:127      RPR: Non Reactive (01/17 1106)   Rhogam 06/14/17 HBsAg: Negative (01/17 1106)   TDaP vaccine  07/01/17                     HIV: Non Reactive (01/17 1106)   Flu Shot                                GBS:   Contraception  Given information 05/31/17 Pap: NIL (2019)  CBB   Given information 05/31/17   CS/VBAC    Baby Food  Breast   Support Person                 Gestational age appropriate obstetric precautions including but not limited to vaginal bleeding, contractions, leaking of fluid and fetal movement were reviewed in detail with the patient.   - Closed narrow arch prominenet ishial spines - GBS today  Return in about 1 week (around 08/13/2017) for ROB.  Vena AustriaAndreas Mckyla Deckman, MD, Merlinda FrederickFACOG [facility], [facility]

## 2017-08-06 NOTE — Progress Notes (Signed)
ROB GBS 

## 2017-08-10 LAB — STREP GP B CULTURE+RFLX: STREP GP B CULTURE+RFLX: NEGATIVE

## 2017-08-13 ENCOUNTER — Ambulatory Visit (INDEPENDENT_AMBULATORY_CARE_PROVIDER_SITE_OTHER): Payer: 59 | Admitting: Certified Nurse Midwife

## 2017-08-13 ENCOUNTER — Encounter: Payer: Self-pay | Admitting: Certified Nurse Midwife

## 2017-08-13 VITALS — BP 100/60 | Wt 158.0 lb

## 2017-08-13 DIAGNOSIS — M5432 Sciatica, left side: Secondary | ICD-10-CM

## 2017-08-13 DIAGNOSIS — O9989 Other specified diseases and conditions complicating pregnancy, childbirth and the puerperium: Secondary | ICD-10-CM

## 2017-08-13 DIAGNOSIS — O0990 Supervision of high risk pregnancy, unspecified, unspecified trimester: Secondary | ICD-10-CM

## 2017-08-13 DIAGNOSIS — Z3A37 37 weeks gestation of pregnancy: Secondary | ICD-10-CM

## 2017-08-13 NOTE — Progress Notes (Signed)
C/o sciatic nerve problems.rj

## 2017-08-13 NOTE — Progress Notes (Signed)
ROB at 37wk2d: Good FM. Having some left sciatic type pain intermittently. Discussed doing stretches/ use of Biofreeze GBS was negative. Unsure about pp contraception- no Depo Having infrequent contractions. No VB ROB in 1 week Given L&D # Farrel Connersolleen Larren Copes, CNM

## 2017-08-20 ENCOUNTER — Ambulatory Visit (INDEPENDENT_AMBULATORY_CARE_PROVIDER_SITE_OTHER): Payer: 59 | Admitting: Maternal Newborn

## 2017-08-20 ENCOUNTER — Encounter: Payer: Self-pay | Admitting: Maternal Newborn

## 2017-08-20 VITALS — BP 110/76 | Wt 162.5 lb

## 2017-08-20 DIAGNOSIS — O23593 Infection of other part of genital tract in pregnancy, third trimester: Secondary | ICD-10-CM | POA: Diagnosis not present

## 2017-08-20 DIAGNOSIS — Z3A38 38 weeks gestation of pregnancy: Secondary | ICD-10-CM

## 2017-08-20 DIAGNOSIS — B373 Candidiasis of vulva and vagina: Secondary | ICD-10-CM | POA: Diagnosis not present

## 2017-08-20 DIAGNOSIS — B3731 Acute candidiasis of vulva and vagina: Secondary | ICD-10-CM

## 2017-08-20 DIAGNOSIS — O0990 Supervision of high risk pregnancy, unspecified, unspecified trimester: Secondary | ICD-10-CM

## 2017-08-20 DIAGNOSIS — O26893 Other specified pregnancy related conditions, third trimester: Secondary | ICD-10-CM

## 2017-08-20 DIAGNOSIS — N898 Other specified noninflammatory disorders of vagina: Secondary | ICD-10-CM

## 2017-08-20 DIAGNOSIS — O09293 Supervision of pregnancy with other poor reproductive or obstetric history, third trimester: Secondary | ICD-10-CM

## 2017-08-20 LAB — POCT WET PREP (WET MOUNT)
Clue Cells Wet Prep Whiff POC: NEGATIVE
TRICHOMONAS WET PREP HPF POC: ABSENT

## 2017-08-20 MED ORDER — TERCONAZOLE 0.4 % VA CREA: 1.0000 | TOPICAL_CREAM | Freq: Every day | VAGINAL | 0 refills | Status: AC

## 2017-08-20 NOTE — Progress Notes (Signed)
    Routine Prenatal Care Visit  Subjective  Alexis Petersen is a 32 y.o. G2P1001 at [redacted]w[redacted]d being seen today for ongoing prenatal care.  She is currently monitored for the following issues for this high-risk pregnancy and has Anxiety and depression; History of gestational diabetes; Migraine without aura; Paresthesia; Pregnancy, supervision, high-risk, unspecified trimester; and Previous cesarean section complicating pregnancy on their problem list.  ----------------------------------------------------------------------------------- Patient reports vaginal discharge and irritation. Contractions: Not present. Movement: Present. No leaking of fluid.  ----------------------------------------------------------------------------------- The following portions of the patient's history were reviewed and updated as appropriate: allergies, current medications, past family history, past medical history, past social history, past surgical history and problem list. Problem list updated.   Objective  Blood pressure 110/76, weight 162 lb 8 oz (73.7 kg), last menstrual period 11/25/2016. Pregravid weight 130 lb (59 kg) Total Weight Gain 32 lb 8 oz (14.7 kg) Body mass index is 27.89 kg/m. Urinalysis: Urine Protein: Negative Urine Glucose: Trace  Fetal Status: Fetal Heart Rate (bpm): 143 Fundal Height: 35 cm Movement: Present  Presentation: Vertex  General:  Alert, oriented and cooperative. Patient is in no acute distress.  Skin: Skin is warm and dry. No rash noted.   Cardiovascular: Normal heart rate noted  Respiratory: Normal respiratory effort, no problems with respiration noted  Abdomen: Soft, gravid, appropriate for gestational age. Pain/Pressure: Absent     Pelvic:  Cervical exam performed Dilation: Closed Effacement (%): Thick Station: -3  Extremities: Normal range of motion.     Mental Status: Normal mood and affect. Normal behavior. Normal judgment and thought content.   Wet prep positive for  yeast.  Assessment   32 y.o. G2P1001 at 3450w2d, EDD 09/01/2017 by Last Menstrual Period presenting for routine prenatal visit.  Plan   pregnancy#2 Problems (from 11/25/16 to present)    Problem Noted Resolved   Pregnancy, supervision, high-risk, unspecified trimester 02/11/2017 by Natale MilchSchuman, Christanna R, MD No   Overview Addendum 08/10/2017 12:53 PM by Vena AustriaStaebler, Andreas, MD      [facility]  Prenatal Labs  Dating  LMP = 11wk US Blood type: O/Negative/-- (01/17 1106)   Genetic Screen NIPS:   Normal XX Antibody:Negative (01/17 1106)  Anatomic US Complete  Rubella: 7.93 (01/17 1106) Varicella: Immune  GTT Early:        28 wk:127      RPR: Non Reactive (01/17 1106)   Rhogam 06/14/17 HBsAg: Negative (01/17 1106)   TDaP vaccine  07/01/17                     HIV: Non Reactive (01/17 1106)   Flu Shot                                MVH:QIONGEXBGBS:negative  Contraception  Given information 05/31/17 Pap: NIL (2019)  CBB   Given information 05/31/17   CS/VBAC    Baby Food  Breast   Support Person              Rx sent for terconazole.  Term labor symptoms and general obstetric precautions including but not limited to vaginal bleeding, contractions, leaking of fluid and fetal movement were reviewed.  Return in about 1 week (around 08/27/2017) for ROB.  Marcelyn BruinsJacelyn Schmid, CNM 08/20/2017  10:31 AM

## 2017-08-20 NOTE — Progress Notes (Signed)
No concerns.rj 

## 2017-08-27 ENCOUNTER — Ambulatory Visit (INDEPENDENT_AMBULATORY_CARE_PROVIDER_SITE_OTHER): Payer: 59 | Admitting: Certified Nurse Midwife

## 2017-08-27 ENCOUNTER — Encounter: Payer: Self-pay | Admitting: Certified Nurse Midwife

## 2017-08-27 VITALS — BP 110/60 | Wt 162.0 lb

## 2017-08-27 DIAGNOSIS — O0990 Supervision of high risk pregnancy, unspecified, unspecified trimester: Secondary | ICD-10-CM

## 2017-08-27 DIAGNOSIS — O0993 Supervision of high risk pregnancy, unspecified, third trimester: Secondary | ICD-10-CM

## 2017-08-27 DIAGNOSIS — Z3A39 39 weeks gestation of pregnancy: Secondary | ICD-10-CM

## 2017-08-27 NOTE — Progress Notes (Signed)
C/o still trying to get rid of yeast inf - would rather no have cx ck.rj

## 2017-08-28 NOTE — Progress Notes (Signed)
ROB at 39wk2days: Denies regular contractions, vaginal bleeding. Still using terconazole for yeast infection. Baby active. Has CS scheduled for 8/8 if NIL. Preop with Dr Jean RosenthalJackson on 8/6.  Labor precautions Farrel Connersolleen Loa Idler, CNM

## 2017-08-31 ENCOUNTER — Ambulatory Visit (INDEPENDENT_AMBULATORY_CARE_PROVIDER_SITE_OTHER): Payer: 59 | Admitting: Obstetrics and Gynecology

## 2017-08-31 ENCOUNTER — Encounter: Payer: Self-pay | Admitting: Obstetrics and Gynecology

## 2017-08-31 VITALS — BP 112/64 | HR 97 | Ht 64.0 in | Wt 168.0 lb

## 2017-08-31 DIAGNOSIS — O0993 Supervision of high risk pregnancy, unspecified, third trimester: Secondary | ICD-10-CM

## 2017-08-31 DIAGNOSIS — O360110 Maternal care for anti-D [Rh] antibodies, first trimester, not applicable or unspecified: Secondary | ICD-10-CM

## 2017-08-31 DIAGNOSIS — F329 Major depressive disorder, single episode, unspecified: Secondary | ICD-10-CM

## 2017-08-31 DIAGNOSIS — F419 Anxiety disorder, unspecified: Secondary | ICD-10-CM

## 2017-08-31 DIAGNOSIS — O99343 Other mental disorders complicating pregnancy, third trimester: Secondary | ICD-10-CM

## 2017-08-31 DIAGNOSIS — O34219 Maternal care for unspecified type scar from previous cesarean delivery: Secondary | ICD-10-CM

## 2017-08-31 DIAGNOSIS — O09293 Supervision of pregnancy with other poor reproductive or obstetric history, third trimester: Secondary | ICD-10-CM

## 2017-08-31 DIAGNOSIS — Z3A39 39 weeks gestation of pregnancy: Secondary | ICD-10-CM

## 2017-08-31 DIAGNOSIS — Z8632 Personal history of gestational diabetes: Secondary | ICD-10-CM

## 2017-08-31 DIAGNOSIS — Z6791 Unspecified blood type, Rh negative: Secondary | ICD-10-CM | POA: Insufficient documentation

## 2017-08-31 DIAGNOSIS — O26899 Other specified pregnancy related conditions, unspecified trimester: Secondary | ICD-10-CM

## 2017-08-31 DIAGNOSIS — F418 Other specified anxiety disorders: Secondary | ICD-10-CM

## 2017-08-31 NOTE — H&P (View-Only) (Signed)
OB History & Physical   History of Present Illness:  Chief Complaint: preoperative visit for cesarean delivery  HPI:  Alexis Petersen is a 32 y.o. G2P1001 female at [redacted]w[redacted]d dated by LMP consistent with 11 week ultrasound.  Her pregnancy has been complicated by history of cesarean section, history of gestational diabetes in prior pregnancy, history of anxiety and depression.  She is also rh negative. She received rhogam on 06/14/17.  She received TDaP on 07/01/17.    She denies contractions.   She denies leakage of fluid.   She denies vaginal bleeding.   She reports fetal movement.    Maternal Medical History:   Past Medical History:  Diagnosis Date  . Anxiety   . Anxiety and depression   . Depression   . Gestational diabetes   . Migraine without aura     Past Surgical History:  Procedure Laterality Date  . CESAREAN SECTION  2016  . WISDOM TOOTH EXTRACTION  2014    Allergies  Allergen Reactions  . Penicillins     Tongue swelling Has patient had a PCN reaction causing immediate rash, facial/tongue/throat swelling, SOB or lightheadedness with hypotension: Yes Has patient had a PCN reaction causing severe rash involving mucus membranes or skin necrosis: No Has patient had a PCN reaction that required hospitalization: Unknown Has patient had a PCN reaction occurring within the last 10 years: No If all of the above answers are "NO", then may proceed with Cephalosporin use.   . Sulfa Antibiotics     Swollen tongue   . Benadryl [Diphenhydramine Hcl (Sleep)] Rash    Prior to Admission medications   Medication Sig Start Date End Date Taking? Authorizing Provider  acetaminophen (TYLENOL) 500 MG tablet Take 1,000 mg by mouth daily as needed for moderate pain or headache.   Yes [provider]  ferrous sulfate (FERROUSUL) 325 (65 FE) MG tablet Take 1 tablet (325 mg total) by mouth 2 (two) times daily. 07/01/17  Yes Schuman, Christanna R, MD  Menthol, Topical Analgesic,  (BIOFREEZE EX) Apply 1 application topically daily as needed (pain).   Yes [provider]  Prenatal Vit-Fe Fumarate-FA (PRENATAL PO) Take 1 tablet by mouth daily.   Yes [provider]  sodium chloride (OCEAN) 0.65 % SOLN nasal spray Place 1 spray into both nostrils as needed for congestion.   Yes [provider]  augmented betamethasone dipropionate (DIPROLENE-AF) 0.05 % cream Apply 1 application topically 2 (two) times daily. 08/19/17   [provider]  butalbital-acetaminophen-caffeine (FIORICET, ESGIC) 50-325-40 MG tablet Take 1 tablet by mouth every 4 (four) hours as needed for headache. Patient not taking: Reported on 08/31/2017 03/23/17   Nadara Mustard, MD    OB History  Gravida Para Term Preterm AB Living  2 1 1     1   SAB TAB Ectopic Multiple Live Births          1    # Outcome Date GA Lbr Len/2nd Weight Sex Delivery Anes PTL Lv  2 Current           1 Term 03/07/14 [redacted]w[redacted]d  5 lb 13 oz (2.637 kg) M CS-LTranv   LIV     Complications: Fetal Intolerance    Prenatal care site: [facility]  Social History: She  reports that she has quit smoking. Her smoking use included cigarettes. She has a 6.00 pack-year smoking history. She has never used smokeless tobacco. She reports that she has current or past drug history.  Drug: Marijuana. She reports that she does not drink alcohol.  Family History: family history includes Hypertension in her father; Stroke in her father.   Review of Systems: Negative x 10 systems reviewed except as noted in the HPI.    Physical Exam:  Vital Signs: BP 112/64 (BP Location: Left Arm, Patient Position: Sitting, Cuff Size: Normal)   Pulse 97   Ht 5\' 4"  (1.626 m)   Wt 168 lb (76.2 kg)   LMP 11/25/2016 (Approximate) Comment: using condoms, ok to do per Dr Para Marchuncan  SpO2 97%   BMI 28.84 kg/m  Constitutional: Well nourished, well developed female in no acute distress.  HEENT: normal Skin: Warm and dry.  Cardiovascular:  Regular rate and rhythm.   Extremity: no edema  Respiratory: Clear to auscultation bilateral. Normal respiratory effort Abdomen: FHT present and gravid/NT Back: no CVAT Neuro: DTRs 2+, Cranial nerves grossly intact Psych: Alert and Oriented x3. No memory deficits. Normal mood and affect.  MS: normal gait, normal bilateral lower extremity ROM/strength/stability.  Pertinent Results:  Prenatal Labs: Blood type/Rh O negative  Antibody screen negative  Rubella Immune  Varicella Immune    RPR NR  HBsAg negative  HIV negative  GC negative  Chlamydia negative  Genetic screening 1st trim screen positive, NIPT diploid XX  1 hour GTT Early 102,  Third trimester 127  3 hour GTT n/a  GBS negative on 08/06/17   Assessment:  Alexis Petersen is a 32 y.o. 472P1001 female at 3481w6d for preoperative visit for cesarean delivery with history of prior.   Plan:  1. Admit to Labor & Delivery  2. CBC, T&S, Clrs, IVF 3. GBS negative.   4. Admit to L&D for surgery   Thomasene MohairStephen Jackson, MD 08/31/2017 9:05 AM

## 2017-08-31 NOTE — Progress Notes (Signed)
OB History & Physical   History of Present Illness:  Chief Complaint: preoperative visit for cesarean delivery  HPI:  Alexis Petersen is a 32 y.o. G2P1001 female at [redacted]w[redacted]d dated by LMP consistent with 11 week ultrasound.  Her pregnancy has been complicated by history of cesarean section, history of gestational diabetes in prior pregnancy, history of anxiety and depression.  She is also rh negative. She received rhogam on 06/14/17.  She received TDaP on 07/01/17.    She denies contractions.   She denies leakage of fluid.   She denies vaginal bleeding.   She reports fetal movement.    Maternal Medical History:   Past Medical History:  Diagnosis Date  . Anxiety   . Anxiety and depression   . Depression   . Gestational diabetes   . Migraine without aura     Past Surgical History:  Procedure Laterality Date  . CESAREAN SECTION  2016  . WISDOM TOOTH EXTRACTION  2014    Allergies  Allergen Reactions  . Penicillins     Tongue swelling Has patient had a PCN reaction causing immediate rash, facial/tongue/throat swelling, SOB or lightheadedness with hypotension: Yes Has patient had a PCN reaction causing severe rash involving mucus membranes or skin necrosis: No Has patient had a PCN reaction that required hospitalization: Unknown Has patient had a PCN reaction occurring within the last 10 years: No If all of the above answers are "NO", then may proceed with Cephalosporin use.   . Sulfa Antibiotics     Swollen tongue   . Benadryl [Diphenhydramine Hcl (Sleep)] Rash    Prior to Admission medications   Medication Sig Start Date End Date Taking? Authorizing Provider  acetaminophen (TYLENOL) 500 MG tablet Take 1,000 mg by mouth daily as needed for moderate pain or headache.   Yes [provider]  ferrous sulfate (FERROUSUL) 325 (65 FE) MG tablet Take 1 tablet (325 mg total) by mouth 2 (two) times daily. 07/01/17  Yes Schuman, Christanna R, MD  Menthol, Topical Analgesic,  (BIOFREEZE EX) Apply 1 application topically daily as needed (pain).   Yes [provider]  Prenatal Vit-Fe Fumarate-FA (PRENATAL PO) Take 1 tablet by mouth daily.   Yes [provider]  sodium chloride (OCEAN) 0.65 % SOLN nasal spray Place 1 spray into both nostrils as needed for congestion.   Yes [provider]  augmented betamethasone dipropionate (DIPROLENE-AF) 0.05 % cream Apply 1 application topically 2 (two) times daily. 08/19/17   [provider]  butalbital-acetaminophen-caffeine (FIORICET, ESGIC) 50-325-40 MG tablet Take 1 tablet by mouth every 4 (four) hours as needed for headache. Patient not taking: Reported on 08/31/2017 03/23/17   Nadara Mustard, MD    OB History  Gravida Para Term Preterm AB Living  2 1 1     1   SAB TAB Ectopic Multiple Live Births          1    # Outcome Date GA Lbr Len/2nd Weight Sex Delivery Anes PTL Lv  2 Current           1 Term 03/07/14 [redacted]w[redacted]d  5 lb 13 oz (2.637 kg) M CS-LTranv   LIV     Complications: Fetal Intolerance    Prenatal care site: [facility]  Social History: She  reports that she has quit smoking. Her smoking use included cigarettes. She has a 6.00 pack-year smoking history. She has never used smokeless tobacco. She reports that she has current or past drug history.  Drug: Marijuana. She reports that she does not drink alcohol.  Family History: family history includes Hypertension in her father; Stroke in her father.   Review of Systems: Negative x 10 systems reviewed except as noted in the HPI.    Physical Exam:  Vital Signs: BP 112/64 (BP Location: Left Arm, Patient Position: Sitting, Cuff Size: Normal)   Pulse 97   Ht 5\' 4"  (1.626 m)   Wt 168 lb (76.2 kg)   LMP 11/25/2016 (Approximate) Comment: using condoms, ok to do per Dr Para Marchuncan  SpO2 97%   BMI 28.84 kg/m  Constitutional: Well nourished, well developed female in no acute distress.  HEENT: normal Skin: Warm and dry.  Cardiovascular:  Regular rate and rhythm.   Extremity: no edema  Respiratory: Clear to auscultation bilateral. Normal respiratory effort Abdomen: FHT present and gravid/NT Back: no CVAT Neuro: DTRs 2+, Cranial nerves grossly intact Psych: Alert and Oriented x3. No memory deficits. Normal mood and affect.  MS: normal gait, normal bilateral lower extremity ROM/strength/stability.  Pertinent Results:  Prenatal Labs: Blood type/Rh O negative  Antibody screen negative  Rubella Immune  Varicella Immune    RPR NR  HBsAg negative  HIV negative  GC negative  Chlamydia negative  Genetic screening 1st trim screen positive, NIPT diploid XX  1 hour GTT Early 102,  Third trimester 127  3 hour GTT n/a  GBS negative on 08/06/17   Assessment:  Alexis Petersen is a 32 y.o. 472P1001 female at 3481w6d for preoperative visit for cesarean delivery with history of prior.   Plan:  1. Admit to Labor & Delivery  2. CBC, T&S, Clrs, IVF 3. GBS negative.   4. Admit to L&D for surgery   Thomasene MohairStephen Kortlynn Poust, MD 08/31/2017 9:05 AM

## 2017-09-01 ENCOUNTER — Other Ambulatory Visit: Payer: Self-pay

## 2017-09-01 ENCOUNTER — Encounter
Admission: RE | Admit: 2017-09-01 | Discharge: 2017-09-01 | Disposition: A | Discharge status: Home / Self Care | Payer: 59 | Source: Ambulatory Visit | Attending: Obstetrics and Gynecology | Admitting: Obstetrics and Gynecology

## 2017-09-01 ENCOUNTER — Encounter: Payer: 59 | Admitting: Obstetrics and Gynecology

## 2017-09-01 HISTORY — DX: Pneumonia, unspecified organism: J18.9

## 2017-09-01 HISTORY — DX: Other complications of anesthesia, initial encounter: T88.59XA

## 2017-09-01 HISTORY — DX: Adverse effect of unspecified anesthetic, initial encounter: T41.45XA

## 2017-09-01 LAB — RAPID HIV SCREEN (HIV 1/2 AB+AG)
HIV 1/2 ANTIBODIES: NONREACTIVE
HIV-1 P24 ANTIGEN - HIV24: NONREACTIVE

## 2017-09-01 LAB — CBC
HCT: 38.5 % (ref 35.0–47.0)
HEMOGLOBIN: 13.3 g/dL (ref 12.0–16.0)
MCH: 33.1 pg (ref 26.0–34.0)
MCHC: 34.5 g/dL (ref 32.0–36.0)
MCV: 95.9 fL (ref 80.0–100.0)
Platelets: 299 10*3/uL (ref 150–440)
RBC: 4.02 MIL/uL (ref 3.80–5.20)
RDW: 16.4 % — ABNORMAL HIGH (ref 11.5–14.5)
WBC: 14.1 10*3/uL — ABNORMAL HIGH (ref 3.6–11.0)

## 2017-09-01 MED ORDER — CLINDAMYCIN PHOSPHATE 900 MG/50ML IV SOLN
900.0000 mg | INTRAVENOUS | Status: AC
  Administered 2017-09-02: 900 mg via INTRAVENOUS
  Filled 2017-09-01: qty 50

## 2017-09-01 MED ORDER — GENTAMICIN SULFATE 40 MG/ML IJ SOLN
380.0000 mg | INTRAVENOUS | Status: AC
  Administered 2017-09-02: 380 mg via INTRAVENOUS
  Filled 2017-09-01: qty 9.5

## 2017-09-01 MED ORDER — CLINDAMYCIN PHOSPHATE 900 MG/50ML IV SOLN: 900.0000 mg | INTRAVENOUS | Status: DC

## 2017-09-01 MED ORDER — DEXTROSE 5 % IV SOLN
5.0000 mg/kg | INTRAVENOUS | Status: DC
  Filled 2017-09-01: qty 9.5

## 2017-09-01 MED ORDER — BUPIVACAINE 0.25 % ON-Q PUMP DUAL CATH 400 ML
400.0000 mL | INJECTION | Status: DC
  Filled 2017-09-01: qty 400

## 2017-09-01 NOTE — Patient Instructions (Signed)
Your procedure is scheduled on: Tomorrow Report to THE [facility], ENTER THRU THE EMERGENCY DEPARTMENT AT 5:45 AM .  Remember: Instructions that are not followed completely may result in serious medical risk, up to and including death, or upon the discretion of your surgeon and anesthesiologist your surgery may need to be rescheduled.     _X__ 1. Do not eat food after midnight the night before your procedure.                 No gum chewing or hard candies. You may drink clear liquids up to 2 hours                 before you are scheduled to arrive for your surgery- DO not drink clear                 liquids within 2 hours of the start of your surgery.                 Clear Liquids include:  water, apple juice without pulp, clear carbohydrate                 drink such as Clearfast or Gatorade, Black Coffee or Tea (Do not add                 anything to coffee or tea).  __X__2.  On the morning of surgery brush your teeth with toothpaste and water, you                 may rinse your mouth with mouthwash if you wish.  Do not swallow any              toothpaste of mouthwash.     _X__ 3.  No Alcohol for 24 hours before or after surgery.   _X__ 4.  Do Not Smoke or use e-cigarettes For 24 Hours Prior to Your Surgery.                 Do not use any chewable tobacco products for at least 6 hours prior to                 surgery.  ____  5.  Bring all medications with you on the day of surgery if instructed.   __X__  6.  Notify your doctor if there is any change in your medical condition      (cold, fever, infections).     Do not wear jewelry, make-up, hairpins, clips or nail polish. Do not wear lotions, powders, or perfumes.  Do not shave 48 hours prior to surgery. Men may shave face and neck. Do not bring valuables to the hospital.    [facility] is not responsible for any belongings or valuables.  Contacts, dentures/partials or body piercings may not be worn into surgery. Bring a case for  your contacts, glasses or hearing aids, a denture cup will be supplied. Leave your suitcase in the car. After surgery it may be brought to your room. For patients admitted to the hospital, discharge time is determined by your treatment team.   Patients discharged the day of surgery will not be allowed to drive home.   Please read over the following fact sheets that you were given:   MRSA Information  __X__ Take these medicines the morning of surgery with A SIP OF WATER:    1. NONE  2.   3.   4.  5.  6.  ____ [employer]  Enema (as directed)   __X__ Use CHG Soap/SAGE wipes as directed  ____ Use inhalers on the day of surgery  ____ Stop metformin/Janumet/Farxiga 2 days prior to surgery    ____ Take 1/2 of usual insulin dose the night before surgery. No insulin the morning          of surgery.   ____ Stop Blood Thinners Coumadin/Plavix/Xarelto/Pleta/Pradaxa/Eliquis/Effient/Aspirin  on   Or contact your Surgeon, Cardiologist or Medical Doctor regarding  ability to stop your blood thinners  __X__ Stop Anti-inflammatories 7 days before surgery such as Advil, Ibuprofen, Motrin,  BC or Goodies Powder, Naprosyn, Naproxen, Aleve, Aspirin    __X__ Stop all herbal supplements, fish oil or vitamin E until after surgery.    ____ Bring C-Pap to the hospital.

## 2017-09-02 ENCOUNTER — Encounter
Admission: RE | Disposition: A | Discharge status: Home / Self Care | Payer: Self-pay | Source: Home / Self Care | Attending: Obstetrics and Gynecology

## 2017-09-02 ENCOUNTER — Inpatient Hospital Stay: Payer: 59 | Admitting: Anesthesiology

## 2017-09-02 ENCOUNTER — Inpatient Hospital Stay
Admission: RE | Admit: 2017-09-02 | Discharge: 2017-09-04 | DRG: 787 | Disposition: A | Discharge status: Home / Self Care | Payer: 59 | Attending: Obstetrics and Gynecology | Admitting: Obstetrics and Gynecology

## 2017-09-02 DIAGNOSIS — O9081 Anemia of the puerperium: Secondary | ICD-10-CM | POA: Diagnosis not present

## 2017-09-02 DIAGNOSIS — Z3A40 40 weeks gestation of pregnancy: Secondary | ICD-10-CM | POA: Diagnosis not present

## 2017-09-02 DIAGNOSIS — Z6791 Unspecified blood type, Rh negative: Secondary | ICD-10-CM | POA: Diagnosis not present

## 2017-09-02 DIAGNOSIS — O34211 Maternal care for low transverse scar from previous cesarean delivery: Principal | ICD-10-CM | POA: Diagnosis present

## 2017-09-02 DIAGNOSIS — Z880 Allergy status to penicillin: Secondary | ICD-10-CM

## 2017-09-02 DIAGNOSIS — O360130 Maternal care for anti-D [Rh] antibodies, third trimester, not applicable or unspecified: Secondary | ICD-10-CM | POA: Diagnosis not present

## 2017-09-02 DIAGNOSIS — D62 Acute posthemorrhagic anemia: Secondary | ICD-10-CM | POA: Diagnosis not present

## 2017-09-02 DIAGNOSIS — O0993 Supervision of high risk pregnancy, unspecified, third trimester: Secondary | ICD-10-CM

## 2017-09-02 DIAGNOSIS — Z87891 Personal history of nicotine dependence: Secondary | ICD-10-CM

## 2017-09-02 DIAGNOSIS — F419 Anxiety disorder, unspecified: Secondary | ICD-10-CM | POA: Diagnosis present

## 2017-09-02 DIAGNOSIS — O09293 Supervision of pregnancy with other poor reproductive or obstetric history, third trimester: Secondary | ICD-10-CM

## 2017-09-02 DIAGNOSIS — Z98891 History of uterine scar from previous surgery: Secondary | ICD-10-CM

## 2017-09-02 DIAGNOSIS — O26893 Other specified pregnancy related conditions, third trimester: Secondary | ICD-10-CM | POA: Diagnosis present

## 2017-09-02 DIAGNOSIS — F32A Depression, unspecified: Secondary | ICD-10-CM

## 2017-09-02 DIAGNOSIS — O26899 Other specified pregnancy related conditions, unspecified trimester: Secondary | ICD-10-CM

## 2017-09-02 DIAGNOSIS — Z3A39 39 weeks gestation of pregnancy: Secondary | ICD-10-CM | POA: Diagnosis not present

## 2017-09-02 DIAGNOSIS — F329 Major depressive disorder, single episode, unspecified: Secondary | ICD-10-CM | POA: Diagnosis present

## 2017-09-02 LAB — RPR: RPR Ser Ql: NONREACTIVE

## 2017-09-02 LAB — ABO/RH: ABO/RH(D): O NEG

## 2017-09-02 SURGERY — Surgical Case
Anesthesia: Spinal

## 2017-09-02 MED ORDER — MORPHINE SULFATE (PF) 0.5 MG/ML IJ SOLN
INTRAMUSCULAR | Status: DC | PRN
  Administered 2017-09-02 (×3): 1 mg via INTRAVENOUS
  Administered 2017-09-02: .15 mg via INTRATHECAL
  Administered 2017-09-02: 1 mg via INTRAVENOUS
  Administered 2017-09-02: .85 mg via INTRAVENOUS

## 2017-09-02 MED ORDER — BUPIVACAINE HCL 0.5 % IJ SOLN
INTRAMUSCULAR | Status: DC | PRN
  Administered 2017-09-02: 10 mL

## 2017-09-02 MED ORDER — OXYTOCIN 40 UNITS IN LACTATED RINGERS INFUSION - SIMPLE MED
INTRAVENOUS | Status: DC | PRN
  Administered 2017-09-02: 1000 mL via INTRAVENOUS

## 2017-09-02 MED ORDER — SENNOSIDES-DOCUSATE SODIUM 8.6-50 MG PO TABS
2.0000 | ORAL_TABLET | ORAL | Status: DC
  Administered 2017-09-03 – 2017-09-04 (×2): 2 via ORAL
  Filled 2017-09-02 (×2): qty 2

## 2017-09-02 MED ORDER — IBUPROFEN 600 MG PO TABS: 600.0000 mg | ORAL_TABLET | Freq: Four times a day (QID) | ORAL | Status: DC

## 2017-09-02 MED ORDER — ACETAMINOPHEN 10 MG/ML IV SOLN
INTRAVENOUS | Status: AC
  Filled 2017-09-02: qty 100

## 2017-09-02 MED ORDER — MORPHINE SULFATE (PF) 0.5 MG/ML IJ SOLN
INTRAMUSCULAR | Status: AC
  Filled 2017-09-02: qty 10

## 2017-09-02 MED ORDER — BUPIVACAINE HCL (PF) 0.5 % IJ SOLN
5.0000 mL | Freq: Once | INTRAMUSCULAR | Status: DC
  Filled 2017-09-02: qty 30

## 2017-09-02 MED ORDER — NALOXONE HCL 4 MG/10ML IJ SOLN
1.0000 ug/kg/h | INTRAVENOUS | Status: DC | PRN
  Filled 2017-09-02: qty 5

## 2017-09-02 MED ORDER — NALBUPHINE HCL 10 MG/ML IJ SOLN: 5.0000 mg | Freq: Once | INTRAMUSCULAR | Status: DC | PRN

## 2017-09-02 MED ORDER — NALOXONE HCL 0.4 MG/ML IJ SOLN: 0.4000 mg | INTRAMUSCULAR | Status: DC | PRN

## 2017-09-02 MED ORDER — ONDANSETRON HCL 4 MG/2ML IJ SOLN
INTRAMUSCULAR | Status: AC
  Filled 2017-09-02: qty 2

## 2017-09-02 MED ORDER — COCONUT OIL OIL
1.0000 "application " | TOPICAL_OIL | Status: DC | PRN
  Administered 2017-09-02: 1 via TOPICAL
  Filled 2017-09-02: qty 120

## 2017-09-02 MED ORDER — KETOROLAC TROMETHAMINE 30 MG/ML IJ SOLN
30.0000 mg | Freq: Four times a day (QID) | INTRAMUSCULAR | Status: AC | PRN
  Administered 2017-09-02 – 2017-09-03 (×3): 30 mg via INTRAVENOUS
  Filled 2017-09-02 (×3): qty 1

## 2017-09-02 MED ORDER — NALBUPHINE HCL 10 MG/ML IJ SOLN: 5.0000 mg | INTRAMUSCULAR | Status: DC | PRN

## 2017-09-02 MED ORDER — OXYCODONE-ACETAMINOPHEN 5-325 MG PO TABS: 2.0000 | ORAL_TABLET | ORAL | Status: DC | PRN

## 2017-09-02 MED ORDER — MENTHOL 3 MG MT LOZG: 1.0000 | LOZENGE | OROMUCOSAL | Status: DC | PRN

## 2017-09-02 MED ORDER — BUPIVACAINE HCL (PF) 0.5 % IJ SOLN: 5.0000 mL | Freq: Once | INTRAMUSCULAR | Status: DC

## 2017-09-02 MED ORDER — ACETAMINOPHEN 10 MG/ML IV SOLN
INTRAVENOUS | Status: DC | PRN
  Administered 2017-09-02: 1000 mg via INTRAVENOUS

## 2017-09-02 MED ORDER — ONDANSETRON HCL 4 MG/2ML IJ SOLN
INTRAMUSCULAR | Status: DC | PRN
  Administered 2017-09-02: 4 mg via INTRAVENOUS

## 2017-09-02 MED ORDER — SOD CITRATE-CITRIC ACID 500-334 MG/5ML PO SOLN
30.0000 mL | ORAL | Status: AC
  Administered 2017-09-02: 30 mL via ORAL
  Filled 2017-09-02: qty 15

## 2017-09-02 MED ORDER — SIMETHICONE 80 MG PO CHEW
80.0000 mg | CHEWABLE_TABLET | Freq: Three times a day (TID) | ORAL | Status: DC
  Administered 2017-09-02 – 2017-09-04 (×6): 80 mg via ORAL
  Filled 2017-09-02 (×6): qty 1

## 2017-09-02 MED ORDER — ONDANSETRON HCL 4 MG/2ML IJ SOLN: 4.0000 mg | Freq: Once | INTRAMUSCULAR | Status: DC | PRN

## 2017-09-02 MED ORDER — SODIUM CHLORIDE 0.9 % IV SOLN
INTRAVENOUS | Status: DC | PRN
  Administered 2017-09-02: 25 ug/min via INTRAVENOUS

## 2017-09-02 MED ORDER — GLYCOPYRROLATE 0.2 MG/ML IJ SOLN
INTRAMUSCULAR | Status: AC
  Filled 2017-09-02: qty 1

## 2017-09-02 MED ORDER — KETOROLAC TROMETHAMINE 30 MG/ML IJ SOLN: 30.0000 mg | Freq: Four times a day (QID) | INTRAMUSCULAR | Status: AC | PRN

## 2017-09-02 MED ORDER — PRENATAL MULTIVITAMIN CH
1.0000 | ORAL_TABLET | Freq: Every day | ORAL | Status: DC
  Administered 2017-09-02 – 2017-09-04 (×3): 1 via ORAL
  Filled 2017-09-02 (×3): qty 1

## 2017-09-02 MED ORDER — KETOROLAC TROMETHAMINE 30 MG/ML IJ SOLN
INTRAMUSCULAR | Status: DC | PRN
  Administered 2017-09-02: 30 mg via INTRAVENOUS

## 2017-09-02 MED ORDER — OXYTOCIN 40 UNITS IN LACTATED RINGERS INFUSION - SIMPLE MED
INTRAVENOUS | Status: AC
  Administered 2017-09-02: 2.5 [IU]/h via INTRAVENOUS
  Filled 2017-09-02: qty 1000

## 2017-09-02 MED ORDER — LIDOCAINE HCL (PF) 1 % IJ SOLN
INTRAMUSCULAR | Status: DC | PRN
  Administered 2017-09-02: 3 mL via SUBCUTANEOUS

## 2017-09-02 MED ORDER — DEXAMETHASONE SODIUM PHOSPHATE 10 MG/ML IJ SOLN
INTRAMUSCULAR | Status: DC | PRN
  Administered 2017-09-02: 10 mg via INTRAVENOUS

## 2017-09-02 MED ORDER — BUPIVACAINE IN DEXTROSE 0.75-8.25 % IT SOLN
INTRATHECAL | Status: DC | PRN
  Administered 2017-09-02: 1.6 mL via INTRATHECAL

## 2017-09-02 MED ORDER — SODIUM CHLORIDE 0.9% FLUSH: 3.0000 mL | INTRAVENOUS | Status: DC | PRN

## 2017-09-02 MED ORDER — OXYCODONE-ACETAMINOPHEN 5-325 MG PO TABS: 1.0000 | ORAL_TABLET | ORAL | Status: DC | PRN

## 2017-09-02 MED ORDER — GLYCOPYRROLATE 0.2 MG/ML IJ SOLN
INTRAMUSCULAR | Status: DC | PRN
  Administered 2017-09-02: 0.1 mg via INTRAVENOUS

## 2017-09-02 MED ORDER — LACTATED RINGERS IV SOLN
INTRAVENOUS | Status: DC
  Administered 2017-09-02 – 2017-09-03 (×2): via INTRAVENOUS

## 2017-09-02 MED ORDER — FENTANYL CITRATE (PF) 100 MCG/2ML IJ SOLN: 25.0000 ug | INTRAMUSCULAR | Status: DC | PRN

## 2017-09-02 MED ORDER — MEPERIDINE HCL 25 MG/ML IJ SOLN: 6.2500 mg | INTRAMUSCULAR | Status: DC | PRN

## 2017-09-02 MED ORDER — OXYTOCIN 40 UNITS IN LACTATED RINGERS INFUSION - SIMPLE MED
INTRAVENOUS | Status: AC
  Filled 2017-09-02: qty 1000

## 2017-09-02 MED ORDER — LACTATED RINGERS IV SOLN
INTRAVENOUS | Status: DC | PRN
  Administered 2017-09-02: 07:00:00 via INTRAVENOUS

## 2017-09-02 MED ORDER — DEXAMETHASONE SODIUM PHOSPHATE 10 MG/ML IJ SOLN
INTRAMUSCULAR | Status: AC
  Filled 2017-09-02: qty 1

## 2017-09-02 MED ORDER — ONDANSETRON HCL 4 MG/2ML IJ SOLN: 4.0000 mg | Freq: Three times a day (TID) | INTRAMUSCULAR | Status: DC | PRN

## 2017-09-02 MED ORDER — KETOROLAC TROMETHAMINE 30 MG/ML IJ SOLN
INTRAMUSCULAR | Status: AC
  Filled 2017-09-02: qty 1

## 2017-09-02 MED ORDER — WITCH HAZEL-GLYCERIN EX PADS: 1.0000 "application " | MEDICATED_PAD | CUTANEOUS | Status: DC | PRN

## 2017-09-02 MED ORDER — DIBUCAINE 1 % RE OINT: 1.0000 "application " | TOPICAL_OINTMENT | RECTAL | Status: DC | PRN

## 2017-09-02 MED ORDER — OXYTOCIN 40 UNITS IN LACTATED RINGERS INFUSION - SIMPLE MED
2.5000 [IU]/h | INTRAVENOUS | Status: AC
  Administered 2017-09-02: 2.5 [IU]/h via INTRAVENOUS

## 2017-09-02 MED ORDER — FERROUS SULFATE 325 (65 FE) MG PO TABS
325.0000 mg | ORAL_TABLET | Freq: Two times a day (BID) | ORAL | Status: DC
  Administered 2017-09-02 – 2017-09-04 (×4): 325 mg via ORAL
  Filled 2017-09-02 (×4): qty 1

## 2017-09-02 SURGICAL SUPPLY — 30 items
CANISTER SUCT 3000ML PPV (MISCELLANEOUS) ×3 IMPLANT
CATH KIT ON-Q SILVERSOAK 5IN (CATHETERS) ×6 IMPLANT
CLOSURE WOUND 1/2 X4 (GAUZE/BANDAGES/DRESSINGS) ×1
DERMABOND ADVANCED (GAUZE/BANDAGES/DRESSINGS) ×2
DERMABOND ADVANCED .7 DNX12 (GAUZE/BANDAGES/DRESSINGS) ×1 IMPLANT
DRSG OPSITE POSTOP 4X10 (GAUZE/BANDAGES/DRESSINGS) ×3 IMPLANT
DRSG TELFA 3X8 NADH (GAUZE/BANDAGES/DRESSINGS) ×3 IMPLANT
ELECT CAUTERY BLADE 6.4 (BLADE) ×3 IMPLANT
ELECT REM PT RETURN 9FT ADLT (ELECTROSURGICAL) ×3
ELECTRODE REM PT RTRN 9FT ADLT (ELECTROSURGICAL) ×1 IMPLANT
GAUZE SPONGE 4X4 12PLY STRL (GAUZE/BANDAGES/DRESSINGS) ×3 IMPLANT
GLOVE BIO SURGEON STRL SZ7 (GLOVE) ×12 IMPLANT
GLOVE INDICATOR 7.5 STRL GRN (GLOVE) ×12 IMPLANT
GOWN STRL REUS W/ TWL LRG LVL3 (GOWN DISPOSABLE) ×3 IMPLANT
GOWN STRL REUS W/TWL LRG LVL3 (GOWN DISPOSABLE) ×6
NS IRRIG 1000ML POUR BTL (IV SOLUTION) ×3 IMPLANT
PACK C SECTION AR (MISCELLANEOUS) ×3 IMPLANT
PAD OB MATERNITY 4.3X12.25 (PERSONAL CARE ITEMS) ×6 IMPLANT
PAD PREP 24X41 OB/GYN DISP (PERSONAL CARE ITEMS) ×3 IMPLANT
STRIP CLOSURE SKIN 1/2X4 (GAUZE/BANDAGES/DRESSINGS) ×2 IMPLANT
SUT MNCRL 4-0 (SUTURE) ×2
SUT MNCRL 4-0 27XMFL (SUTURE) ×1
SUT PDS AB 1 TP1 96 (SUTURE) ×3 IMPLANT
SUT PLAIN GUT 0 (SUTURE) IMPLANT
SUT VIC AB 0 CTX 36 (SUTURE) ×4
SUT VIC AB 0 CTX36XBRD ANBCTRL (SUTURE) ×2 IMPLANT
SUT VIC AB 3-0 SH 27 (SUTURE) ×2
SUT VIC AB 3-0 SH 27X BRD (SUTURE) ×1 IMPLANT
SUTURE MNCRL 4-0 27XMF (SUTURE) ×1 IMPLANT
SWABSTK COMLB BENZOIN TINCTURE (MISCELLANEOUS) ×3 IMPLANT

## 2017-09-02 NOTE — Interval H&P Note (Signed)
History and Physical Interval Note:  09/02/2017 7:29 AM  Alexis BougieVictoria L Kmetz  has presented today for surgery, with the diagnosis of previous cesarean,desires repeat section  The various methods of treatment have been discussed with the patient and family. After consideration of risks, benefits and other options for treatment, the patient has consented to  Procedure(s): CESAREAN SECTION (N/A) as a surgical intervention .  The patient's history has been reviewed, patient examined, no change in status, stable for surgery.  I have reviewed the patient's chart and labs.  Questions were answered to the patient's satisfaction.    Thomasene MohairStephen Jackson, MD, Merlinda FrederickFACOG [facility], [facility] 09/02/2017 7:29 AM

## 2017-09-02 NOTE — Anesthesia Procedure Notes (Signed)
Spinal  Patient location during procedure: OB Start time: 09/02/2017 7:47 AM End time: 09/02/2017 7:54 AM Staffing Anesthesiologist: Lenard SimmerKarenz, Andrew, MD Resident/CRNA: Sherol DadeMacMang, Theodosia Bahena H, CRNA Performed: resident/CRNA  Preanesthetic Checklist Completed: patient identified, site marked, surgical consent, pre-op evaluation, timeout performed, IV checked, risks and benefits discussed and monitors and equipment checked Spinal Block Patient position: sitting Prep: ChloraPrep Patient monitoring: heart rate, continuous pulse ox and blood pressure Approach: midline Location: L3-4 Injection technique: single-shot Needle Needle type: Pencan and Introducer  Needle gauge: 24 G Assessment Sensory level: T4 Additional Notes Anesthesia timeout complete prior to spinal placement. O2 and monitors on Pt; tolerated well.

## 2017-09-02 NOTE — Transfer of Care (Signed)
Immediate Anesthesia Transfer of Care Note  Patient: Alexis Petersen  Procedure(s) Performed: CESAREAN SECTION (N/A )  Patient Location: PACU and Mother/Baby  Anesthesia Type:Spinal  Level of Consciousness: awake, alert  and oriented  Airway & Oxygen Therapy: Patient Spontanous Breathing  Post-op Assessment: Report given to RN, Post -op Vital signs reviewed and stable and Patient moving all extremities  Post vital signs: Reviewed and stable  Last Vitals:  Vitals Value Taken Time  BP 107/66 09/02/2017  9:18 AM  Temp 36.5 C 09/02/2017  9:18 AM  Pulse 74 09/02/2017  9:18 AM  Resp 14 09/02/2017  9:18 AM  SpO2 100 % 09/02/2017  9:18 AM    Last Pain:  Vitals:   09/02/17 0918  TempSrc: Oral  PainSc: 0-No pain         Complications: No apparent anesthesia complications

## 2017-09-02 NOTE — Discharge Summary (Signed)
OB Discharge Summary     Patient Name: Alexis Petersen DOB: August 08, 1985 MRN: [id number]  Date of admission: 09/02/2017 Delivering MD: Thomasene Mohair, MD  Date of Delivery: 09/02/2017  Date of discharge: 09/04/2017  Admitting diagnosis: previous cesarean,desires repeat section Intrauterine pregnancy: [redacted]w[redacted]d     Secondary diagnosis: None     Discharge diagnosis: Term Pregnancy Delivered                                                                                                Post partum procedures:rhogam  Augmentation: n/a  Complications: None  Hospital course:  Sceduled C/S   32 y.o. yo G2P2002 at [redacted]w[redacted]d was admitted to the hospital 09/02/2017 for scheduled cesarean section with the following indication:Elective Repeat.  Membrane Rupture Time/Date: 8:21 AM ,09/02/2017   Patient delivered a Viable infant.09/02/2017  Details of operation can be found in separate operative note.  Pateint had an uncomplicated postpartum course.  She is ambulating, tolerating a regular diet, passing flatus, and urinating well. Patient is discharged home in stable condition on  09/04/17         Physical exam  Vitals:   09/03/17 0817 09/03/17 1517 09/03/17 2326 09/04/17 0731  BP: 105/81 118/76 128/78 110/87  Pulse: 79 75 81 75  Resp: 18 18 20 18   Temp: 98.6 F (37 C) 98.6 F (37 C) 98.6 F (37 C) 98.6 F (37 C)  TempSrc: Oral Oral Oral Oral  SpO2: 98% 98% 99% 98%  Weight:      Height:       General: alert, cooperative and no distress Lochia: appropriate Uterine Fundus: firm Incision: Healing well with no significant drainage, Dressing is clean, dry, and intact DVT Evaluation: No evidence of DVT seen on physical exam. No cords or calf tenderness. No significant calf/ankle edema.  Labs: Lab Results  Component Value Date   WBC 13.6 (H) 09/03/2017   HGB 11.1 (L) 09/03/2017   HCT 31.6 (L) 09/03/2017   MCV 97.5 09/03/2017   PLT 236 09/03/2017    Discharge instruction: per After Visit  Summary.  Medications:  Allergies as of 09/04/2017      Reactions   Penicillins    Tongue swelling Has patient had a PCN reaction causing immediate rash, facial/tongue/throat swelling, SOB or lightheadedness with hypotension: Yes Has patient had a PCN reaction causing severe rash involving mucus membranes or skin necrosis: No Has patient had a PCN reaction that required hospitalization: Unknown Has patient had a PCN reaction occurring within the last 10 years: No If all of the above answers are "NO", then may proceed with Cephalosporin use.   Sulfa Antibiotics    Swollen tongue    Benadryl [diphenhydramine Hcl (sleep)] Rash      Medication List    STOP taking these medications   augmented betamethasone dipropionate 0.05 % cream Commonly known as:  DIPROLENE-AF   BIOFREEZE EX   butalbital-acetaminophen-caffeine 50-325-40 MG tablet Commonly known as:  FIORICET, ESGIC   promethazine 25 MG suppository Commonly known as:  PHENERGAN   zolmitriptan 5 MG tablet Commonly known as:  ZOMIG  TAKE these medications   acetaminophen 500 MG tablet Commonly known as:  TYLENOL Take 1,000 mg by mouth daily as needed for moderate pain or headache.   ferrous sulfate 325 (65 FE) MG tablet Take 1 tablet (325 mg total) by mouth 2 (two) times daily.   ibuprofen 600 MG tablet Commonly known as:  ADVIL,MOTRIN Take 1 tablet (600 mg total) by mouth every 6 (six) hours.   oxyCODONE-acetaminophen 5-325 MG tablet Commonly known as:  PERCOCET/ROXICET Take 1 tablet by mouth every 6 (six) hours as needed (breakthrough pain).   PRENATAL PO Take 1 tablet by mouth daily.   sodium chloride 0.65 % Soln nasal spray Commonly known as:  OCEAN Place 1 spray into both nostrils as needed for congestion.            Discharge Care Instructions  (From admission, onward)         Start     Ordered   09/04/17 0000  Discharge wound care:    Comments:  Perform wound care instructions   09/04/17  1047          Diet: routine diet  Activity: Advance as tolerated. Pelvic rest for 6 weeks.   Outpatient follow up: Follow-up Information    Conard NovakJackson, Sullivan Jacuinde D, MD. Schedule an appointment as soon as possible for a visit in 1 week(s).   Specialty:  Obstetrics and Gynecology Why:  incision check Contact information: [address] [place] [address] [phone]             Postpartum contraception: Progesterone only pills Rhogam Given postpartum: yes Rubella vaccine given postpartum: no Varicella vaccine given postpartum: no TDaP given antepartum or postpartum: 07/01/17  Newborn Data: Live born female  Birth Weight: 6 lb 9.5 oz (2990 g) APGAR: 9, 9  Newborn Delivery   Birth date/time:  09/02/2017 08:22:00 Delivery type:  C-Section, Low Transverse Trial of labor:  No C-section categorization:  Repeat    Baby Feeding: Breast  Disposition:home with mother  SIGNED: Thomasene MohairStephen Mccartney Chuba, MD, Merlinda FrederickFACOG [facility], [facility] 09/04/2017 10:49 AM

## 2017-09-02 NOTE — Op Note (Signed)
Cesarean Section Operative Note    Alexis Petersen   09/02/2017   Pre-operative Diagnosis:  1) intrauterine pregnancy at [redacted]w[redacted]d  2) history of previous cesarean, desires repeat section.   Post-operative Diagnosis:  1) intrauterine pregnancy at [redacted]w[redacted]d  2) history of previous cesarean, desires repeat section.   Procedure: Repeat low transverse cesarean section via Pfannenstiel incision and double layer uterine closure  Surgeon: Surgeon(s) and Role:    Conard Novak, MD - Primary  Assistants: Dr. Vena Austria; No other capable assistant available, in surgery requiring high level assistant.  Anesthesia: spinal   Findings:  1) normal appearing gravid uterus, fallopian tubes, and ovaries 2) Adhesions of bladder to lower uterine segment   Estimated Blood Loss: 700 mL  Total IV Fluids: 1,500 ml   Urine Output: 200 mL  Specimens: none  Complications: no complications  Disposition: PACU - hemodynamically stable.   Maternal Condition: stable   Baby condition / location:  Couplet care / Skin to Skin  Procedure Details:  The patient was seen in the Holding Room. The risks, benefits, complications, treatment options, and expected outcomes were discussed with the patient. The patient concurred with the proposed plan, giving informed consent. identified as Alexis Petersen and the procedure verified as C-Section Delivery. A Time Out was held and the above information confirmed.   After induction of anesthesia, the patient was draped and prepped in the usual sterile manner. A Pfannenstiel incision was made and carried down through the subcutaneous tissue to the fascia. Fascial incision was made and extended transversely. The fascia was separated from the underlying rectus tissue superiorly and inferiorly. The peritoneum was identified and entered. Peritoneal incision was extended longitudinally. The bladder flap was sharply freed from the lower uterine segment and all adhesions  taken down with great care to ensure bladder safety. A low transverse uterine incision was made and the hysterotomy was extended with cranial-caudal tension. Delivered from cephalic presentation was a 2,990 gram [name] with Apgar scores of 9 at one minute and 9 at five minutes. Cord ph was not sent the umbilical cord was clamped and cut cord blood was obtained for evaluation. The placenta was removed Intact and appeared normal. The uterine outline, tubes and ovaries appeared normal. The uterine incision was closed with running locked sutures of 0 Vicryl.  A second layer of the same suture was thrown in an imbricating fashion.  Hemostasis was assured.  The uterus was returned to the abdomen and the paracolic gutters were cleared of all clots and debris.  The rectus muscles were inspected and found to be hemostatic.  The On-Q catheter pumps were inserted in accordance with the manufacturer's recommendations.  The catheters were inserted approximately 4cm cephelad to the incision line, approximately 1cm apart, straddling the midline.  They were inserted to a depth of the 4th mark. They were positioned superficial to the rectus abdominus muscles and deep to the rectus fascia.    The fascia was then reapproximated with running sutures of 1-0 PDS, looped. Three interrupted 3-0 vicryl stitches were thrown along the incision line to reduce tension on the skin closure.  The subcuticular closure was performed using 4-0 monocryl. The skin closure was reinforced using surgical skin glue.  The On-Q catheters were bolused with 5 mL of 0.5% marcaine plain for a total of 10 mL.  The catheters were affixed to the skin with surgical skin glue, steri-strips, and tegaderm.    Instrument, sponge, and needle  counts were correct prior the abdominal closure and were correct at the conclusion of the case.  The patient received Clindamycin and Gentamycin prior to skin incision (within 60 minutes). For VTE  prophylaxis she was wearing SCDs throughout the case.  The assistant surgeon was an MD due to lack of availability of another Sales promotion account executivequalified assistant.   Signed: Conard NovakStephen D. Ajai Terhaar, MD 09/02/2017 9:06 AM

## 2017-09-02 NOTE — Anesthesia Post-op Follow-up Note (Signed)
Anesthesia QCDR form completed.        

## 2017-09-02 NOTE — Anesthesia Preprocedure Evaluation (Addendum)
Anesthesia Evaluation  Patient identified by MRN, date of birth, ID band Patient awake    Reviewed: Allergy & Precautions, H&P , NPO status , Patient's Chart, lab work & pertinent test results, reviewed documented beta blocker date and time   History of Anesthesia Complications (+) history of anesthetic complications (difficult epidural)  Airway Mallampati: III  TM Distance: >3 FB Neck ROM: full    Dental  (+) Dental Advidsory Given, Teeth Intact   Pulmonary neg pulmonary ROS, former smoker,           Cardiovascular Exercise Tolerance: Good negative cardio ROS       Neuro/Psych  Headaches, neg Seizures PSYCHIATRIC DISORDERS Anxiety Depression    GI/Hepatic Neg liver ROS, GERD  ,  Endo/Other  negative endocrine ROS  Renal/GU negative Renal ROS  negative genitourinary   Musculoskeletal   Abdominal   Peds  Hematology negative hematology ROS (+)   Anesthesia Other Findings Past Medical History: No date: Anxiety No date: Anxiety and depression No date: Complication of anesthesia     Comment:  had difficult time placing epidural No date: Depression No date: Gestational diabetes No date: Migraine without aura No date: Pneumonia   Reproductive/Obstetrics (+) Pregnancy                            Anesthesia Physical Anesthesia Plan  ASA: II  Anesthesia Plan: Spinal   Post-op Pain Management:    Induction:   PONV Risk Score and Plan:   Airway Management Planned: Nasal Cannula  Additional Equipment:   Intra-op Plan:   Post-operative Plan:   Informed Consent: I have reviewed the patients History and Physical, chart, labs and discussed the procedure including the risks, benefits and alternatives for the proposed anesthesia with the patient or authorized representative who has indicated his/her understanding and acceptance.   Dental Advisory Given  Plan Discussed with:  Anesthesiologist, CRNA and Surgeon  Anesthesia Plan Comments:        Anesthesia Quick Evaluation

## 2017-09-02 NOTE — Lactation Note (Signed)
Lactation Consultation Note  Patient Name: Alexis Petersen [id number]'s Date: 09/02/2017 Reason for consult: Initial assessment   Maternal Data Has patient been taught Hand Expression?: Yes Does the patient have breastfeeding experience prior to this delivery?: Yes  Feeding Feeding Type: Breast Fed  LATCH Score Latch: Repeated attempts needed to sustain latch, nipple held in mouth throughout feeding, stimulation needed to elicit sucking reflex.  Audible Swallowing: A few with stimulation  Type of Nipple: Everted at rest and after stimulation  Comfort (Breast/Nipple): Soft / non-tender  Hold (Positioning): Assistance needed to correctly position infant at breast and maintain latch.  LATCH Score: 7  Interventions Interventions: Breast feeding basics reviewed;Assisted with latch;Skin to skin;Hand express  Lactation Tools Discussed/Used WIC Program: Yes   Consult Status Consult Status: Follow-up  BG is breastfeeding well. Mom has lots of colostrum, seen during instruction on hand expression and baby swallowing. Mom has 3yo who she breastfed "only a few weeks." but stopped because she thought baby wasn't getting enough at the breast and she didn't have a pump. Mom felt better "seeing" how much baby was taking in. Spoke with parents about feeding, latch, clusterfeeding, cues, and how to know baby is getting enough. Mom also stated that she has a Freemie breast pump now.   Alexis Petersen 09/02/2017, 10:17 AM

## 2017-09-03 LAB — TYPE AND SCREEN
ABO/RH(D): O NEG
Antibody Screen: POSITIVE
Extend sample reason: UNDETERMINED
UNIT DIVISION: 0
Unit division: 0

## 2017-09-03 LAB — CBC
HEMATOCRIT: 31.6 % — AB (ref 35.0–47.0)
Hemoglobin: 11.1 g/dL — ABNORMAL LOW (ref 12.0–16.0)
MCH: 34.3 pg — AB (ref 26.0–34.0)
MCHC: 35.2 g/dL (ref 32.0–36.0)
MCV: 97.5 fL (ref 80.0–100.0)
PLATELETS: 236 10*3/uL (ref 150–440)
RBC: 3.24 MIL/uL — ABNORMAL LOW (ref 3.80–5.20)
RDW: 16.2 % — AB (ref 11.5–14.5)
WBC: 13.6 10*3/uL — ABNORMAL HIGH (ref 3.6–11.0)

## 2017-09-03 LAB — BPAM RBC
Blood Product Expiration Date: 201908142359
Blood Product Expiration Date: 201908162359
Unit Type and Rh: 9500
Unit Type and Rh: 9500

## 2017-09-03 LAB — FETAL SCREEN: Fetal Screen: NEGATIVE

## 2017-09-03 MED ORDER — IBUPROFEN 600 MG PO TABS
600.0000 mg | ORAL_TABLET | Freq: Four times a day (QID) | ORAL | Status: DC
  Administered 2017-09-03 – 2017-09-04 (×4): 600 mg via ORAL
  Filled 2017-09-03 (×4): qty 1

## 2017-09-03 MED ORDER — RHO D IMMUNE GLOBULIN 1500 UNIT/2ML IJ SOSY
300.0000 ug | PREFILLED_SYRINGE | Freq: Once | INTRAMUSCULAR | Status: AC
  Administered 2017-09-03: 300 ug via INTRAVENOUS
  Filled 2017-09-03: qty 2

## 2017-09-03 NOTE — Progress Notes (Signed)
POD#1 rLTCS Subjective:  Sitting up in bed, just had breakfast. She was dizzy the first time she got up, but has since been ambulating without difficulty. Voiding with no problems. Pain control good with On-Q and medications. She is having some shoulder pain. Not yet passing gas. No nausea.  Objective:  Blood pressure 105/81, pulse 79, temperature 98.6 F (37 C), temperature source Oral, resp. rate 18, height 5\' 4"  (1.626 m), weight 74.8 kg, last menstrual period 11/25/2016, SpO2 98 %, currently breastfeeding.  General: NAD Pulmonary: no increased work of breathing Abdomen: non-distended, non-tender, fundus firm at level of umbilicus Incision: Dressing C/D/I, On-Q in place Extremities: trace edema, no erythema, no tenderness  Results for orders placed or performed during the hospital encounter of 09/02/17 (from the past 72 hour(s))  ABO/Rh     Status: None   Collection Time: 09/02/17  6:47 AM  Result Value Ref Range   ABO/RH(D)      O NEG Performed at [facility], [address] [place], [place] [address]   Fetal screen     Status: None   Collection Time: 09/03/17  6:10 AM  Result Value Ref Range   Fetal Screen      NEG Performed at [facility], [address] [place], [place] [address]   Rhogam injection     Status: None (Preliminary result)   Collection Time: 09/03/17  6:10 AM  Result Value Ref Range   Unit Number [id number]    Blood Component Type RHIG    Unit division 00    Status of Unit ALLOCATED    Transfusion Status OK TO TRANSFUSE   CBC     Status: Abnormal   Collection Time: 09/03/17  6:10 AM  Result Value Ref Range   WBC 13.6 (H) 3.6 - 11.0 K/uL   RBC 3.24 (L) 3.80 - 5.20 MIL/uL   Hemoglobin 11.1 (L) 12.0 - 16.0 g/dL   HCT 56.231.6 (L) 13.035.0 - 86.547.0 %   MCV 97.5 80.0 - 100.0 fL   MCH 34.3 (H) 26.0 - 34.0 pg   MCHC 35.2 32.0 - 36.0 g/dL   RDW 78.416.2 (H) 69.611.5 - 29.514.5 %   Platelets 236 150 - 440 K/uL    Comment: Performed at [facility], [address] [place], [place] [address]     Assessment:   32 y.o. [phone] post-operative day # 1 recovering well.  Plan:  1) Acute blood loss anemia - hemodynamically stable and asymptomatic.  2) Blood Type --/--/O NEG Performed at [facility], [address] [place], [place] [address]  [phone](08/08 0647) /  Information for the patient's newborn:  Veatrice KellsRogers, Girl Cloey [[id number]][[id number]]  O POS  RhoGam ordered.  Rubella 7.93 (01/17 1106) / Varicella Immune  3) TDAP status: up to date 07/01/17  4) Breast feeding  5) Disposition: continue postpartum care. Anticipate discharge POD#2 or 3.  Marcelyn BruinsJacelyn Muhammad Vacca, CNM 09/03/2017

## 2017-09-03 NOTE — Plan of Care (Signed)
Vs stable; up with assistance (got dizzy when getting up to void); has LR at 14225mL/hr infusing; tolerating regular diet; breastfeeding; on-q pump leaking and may need anther dressing change; incision and incision honeycomb dressing are both WNL

## 2017-09-03 NOTE — Anesthesia Postprocedure Evaluation (Signed)
Anesthesia Post Note  Patient: Alexis Petersen  Procedure(s) Performed: CESAREAN SECTION (N/A )  Patient location during evaluation: Mother Baby Anesthesia Type: Spinal Level of consciousness: oriented and awake and alert Pain management: pain level controlled Vital Signs Assessment: post-procedure vital signs reviewed and stable Respiratory status: spontaneous breathing, respiratory function stable and patient connected to nasal cannula oxygen Cardiovascular status: blood pressure returned to baseline and stable Postop Assessment: no headache, no backache and no apparent nausea or vomiting Anesthetic complications: no     Last Vitals:  Vitals:   09/02/17 2029 09/02/17 2323  BP: 101/64 97/69  Pulse: 67 70  Resp: 18 16  Temp: 37.1 C 37.1 C  SpO2: 97% 97%    Last Pain:  Vitals:   09/03/17 0455  TempSrc:   PainSc: 1                  Starling Mannsurtis,  Sneha Willig A

## 2017-09-03 NOTE — Addendum Note (Signed)
Addendum  created 09/03/17 0741 by Stormy Fabianurtis, Pearson Picou, CRNA   Sign clinical note

## 2017-09-03 NOTE — Anesthesia Post-op Follow-up Note (Signed)
  Anesthesia Pain Follow-up Note  Patient: Alexis Petersen  Day #: 1  Date of Follow-up: 09/03/2017 Time: 7:40 AM  Last Vitals:  Vitals:   09/02/17 2029 09/02/17 2323  BP: 101/64 97/69  Pulse: 67 70  Resp: 18 16  Temp: 37.1 C 37.1 C  SpO2: 97% 97%    Level of Consciousness: alert  Pain: none   Side Effects:None  Catheter Site Exam:clean, dry, no drainage     Plan: D/C from anesthesia care at surgeon's request  Starling MannsCurtis,  Katerin Negrete A

## 2017-09-04 LAB — RHOGAM INJECTION: UNIT DIVISION: 0

## 2017-09-04 MED ORDER — IBUPROFEN 600 MG PO TABS
600.0000 mg | ORAL_TABLET | Freq: Four times a day (QID) | ORAL | Status: DC
  Administered 2017-09-04: 600 mg via ORAL
  Filled 2017-09-04: qty 1

## 2017-09-04 MED ORDER — OXYCODONE-ACETAMINOPHEN 5-325 MG PO TABS: 1.0000 | ORAL_TABLET | Freq: Four times a day (QID) | ORAL | 0 refills | Status: DC | PRN

## 2017-09-04 MED ORDER — IBUPROFEN 600 MG PO TABS: 600.0000 mg | ORAL_TABLET | Freq: Four times a day (QID) | ORAL | 0 refills | Status: DC

## 2017-09-04 NOTE — Progress Notes (Signed)
Discharge instructions provided.  Pt and sig other verbalize understanding of all instructions and follow-up care.  Incision hygiene kit given.  Pt discharged to home with infant at 1340 on 09/04/17 via wheelchair by RN. Reed Breech, RN 09/04/2017 2:18 PM

## 2017-09-04 NOTE — Plan of Care (Signed)
Vs stable; up ad lib; tolerating regular diet; taking scheduled motrin for pain control; encouraged to ambulate more

## 2017-09-04 NOTE — Progress Notes (Signed)
Prenatal records indicate that pt received TDaP vaccine on 07/01/17. Reynold BowenSusan Paisley Shuna Tabor, RN 09/04/2017 11:56 AM

## 2017-09-04 NOTE — Discharge Instructions (Signed)
Call your doctor for increased pain or vaginal bleeding, temperature above 100.4, depression, or concerns.  Keep incision clean and dry.  Please follow instructions in incision hygiene kit.  Call your doctor for incision concerns including redness, swelling, bleeding or drainage, or if begins to come apart.  Increase calories and fluids while breastfeeding.  No strenuous activity or heavy lifting for 6 weeks.  No intercourse, tampons, or douching for 6 weeks.  No tub baths- showers only.  No driving for 2 weeks or while taking pain medications.

## 2017-09-08 ENCOUNTER — Encounter: Payer: Self-pay | Admitting: Obstetrics and Gynecology

## 2017-09-08 ENCOUNTER — Ambulatory Visit (INDEPENDENT_AMBULATORY_CARE_PROVIDER_SITE_OTHER): Payer: 59 | Admitting: Obstetrics and Gynecology

## 2017-09-08 ENCOUNTER — Ambulatory Visit: Payer: 59 | Admitting: Obstetrics and Gynecology

## 2017-09-08 VITALS — BP 108/70 | HR 84 | Ht 64.0 in | Wt 149.0 lb

## 2017-09-08 DIAGNOSIS — Z09 Encounter for follow-up examination after completed treatment for conditions other than malignant neoplasm: Secondary | ICD-10-CM

## 2017-09-08 NOTE — Progress Notes (Signed)
   Postoperative Follow-up Patient presents post op from cesarean section  6days ago.  Subjective: She denies fever, chills, nausea and vomiting. Eating a regular diet without difficulty. Pain is controlled with current analgesics. Medications being used: 1/4 pill of percocet occasionall and ibuprofen.  Activity: increasing slowly. She denies issues with her incision. Lochia appropriate  Objective: BP 108/70 (BP Location: Left Arm, Patient Position: Sitting, Cuff Size: Normal)   Pulse 84   Ht 5\' 4"  (1.626 m)   Wt 149 lb (67.6 kg)   SpO2 99%   BMI 25.58 kg/m   Constitutional: Well nourished, well developed female in no acute distress.  HEENT: normal Skin: Warm and dry.  Abdomen: S/NT/NT, uterus at U-4 clean, dry, intact and without erythema, induration, warmth, and tenderness Extremity: no edema   Assessment: 32 y.o. s/p cesarean section progressing well  Plan: Patient has done well after surgery with no apparent complications.  I have discussed the post-operative course to date, and the expected progress moving forward.  The patient understands what complications to be concerned about.    Activity plan: increase slowly  Return in about 5 weeks (around 10/13/2017) for Six Week Postpartum.  Thomasene MohairStephen Jackson, MD 09/08/2017 4:54 PM

## 2017-09-28 IMAGING — DX DG CERVICAL SPINE COMPLETE 4+V
5 series · 5 of 5 positions shown · non-contrast
Comparison: None.

CLINICAL DATA: Left arm numbness

EXAM:
CERVICAL SPINE - COMPLETE 4+ VIEW

[c-spine lat]
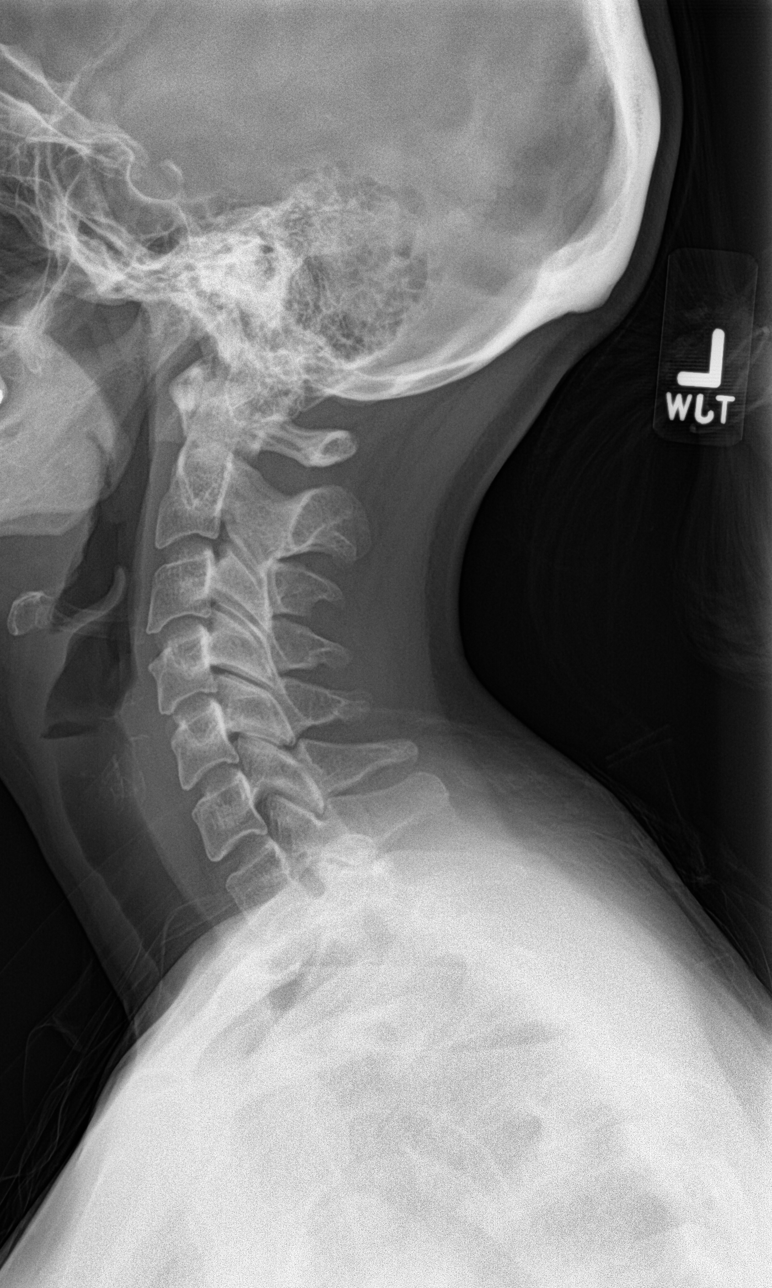

[c-spine obl (1 of 2)]
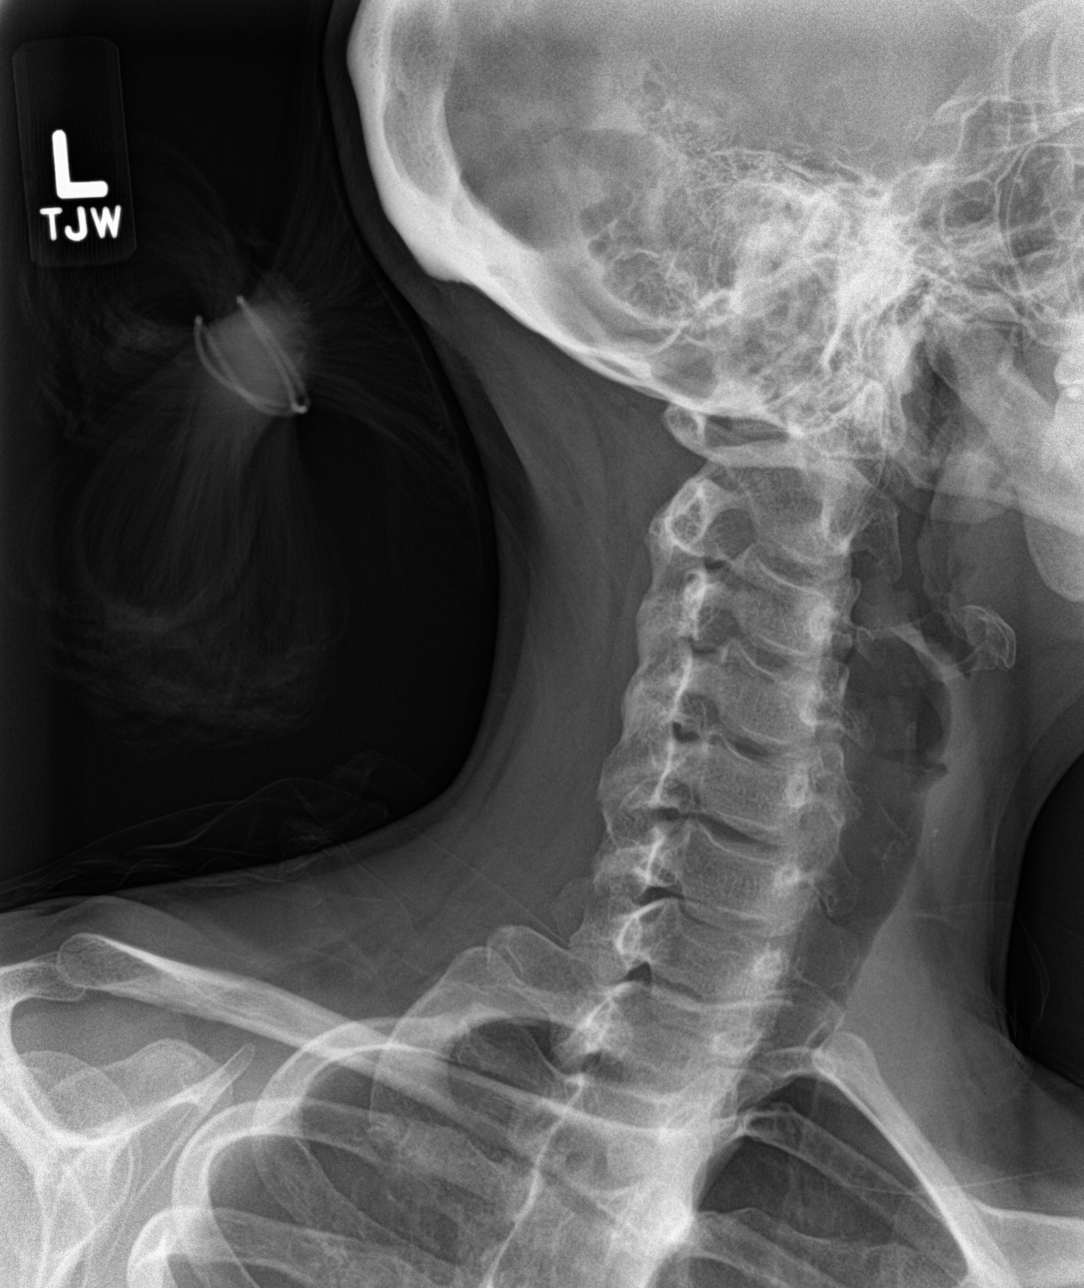

[c-spine obl (2 of 2)]
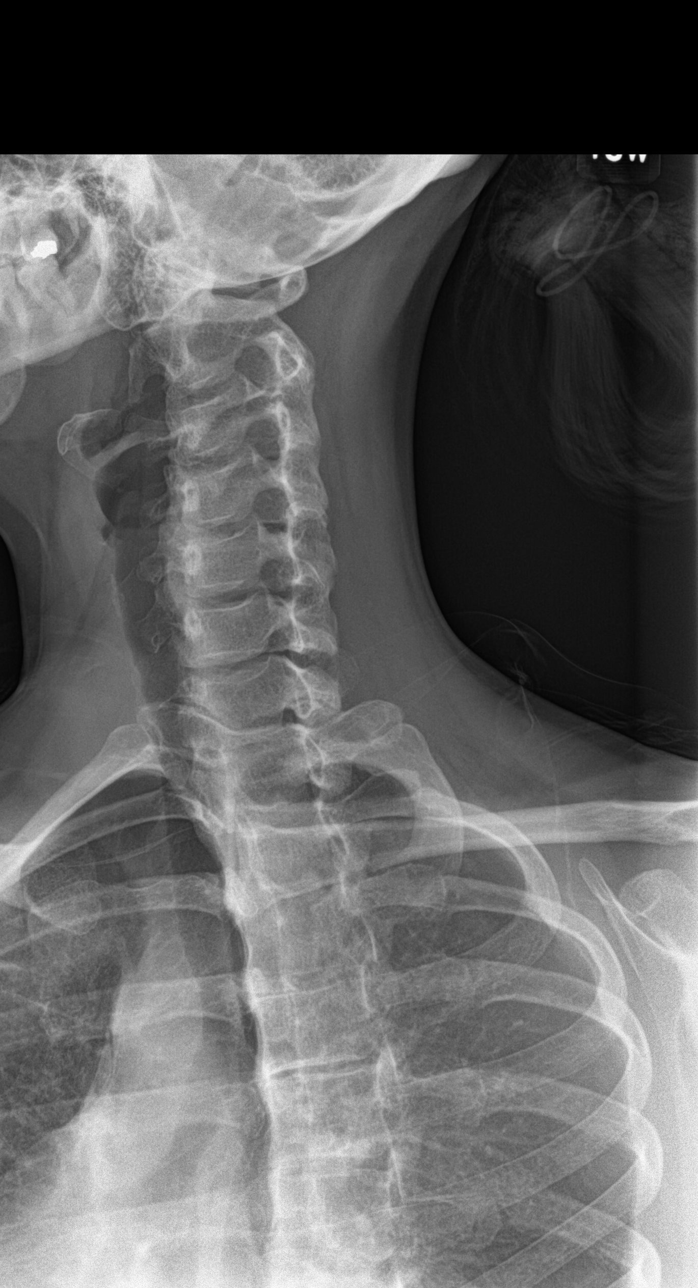

[c-spine ap]
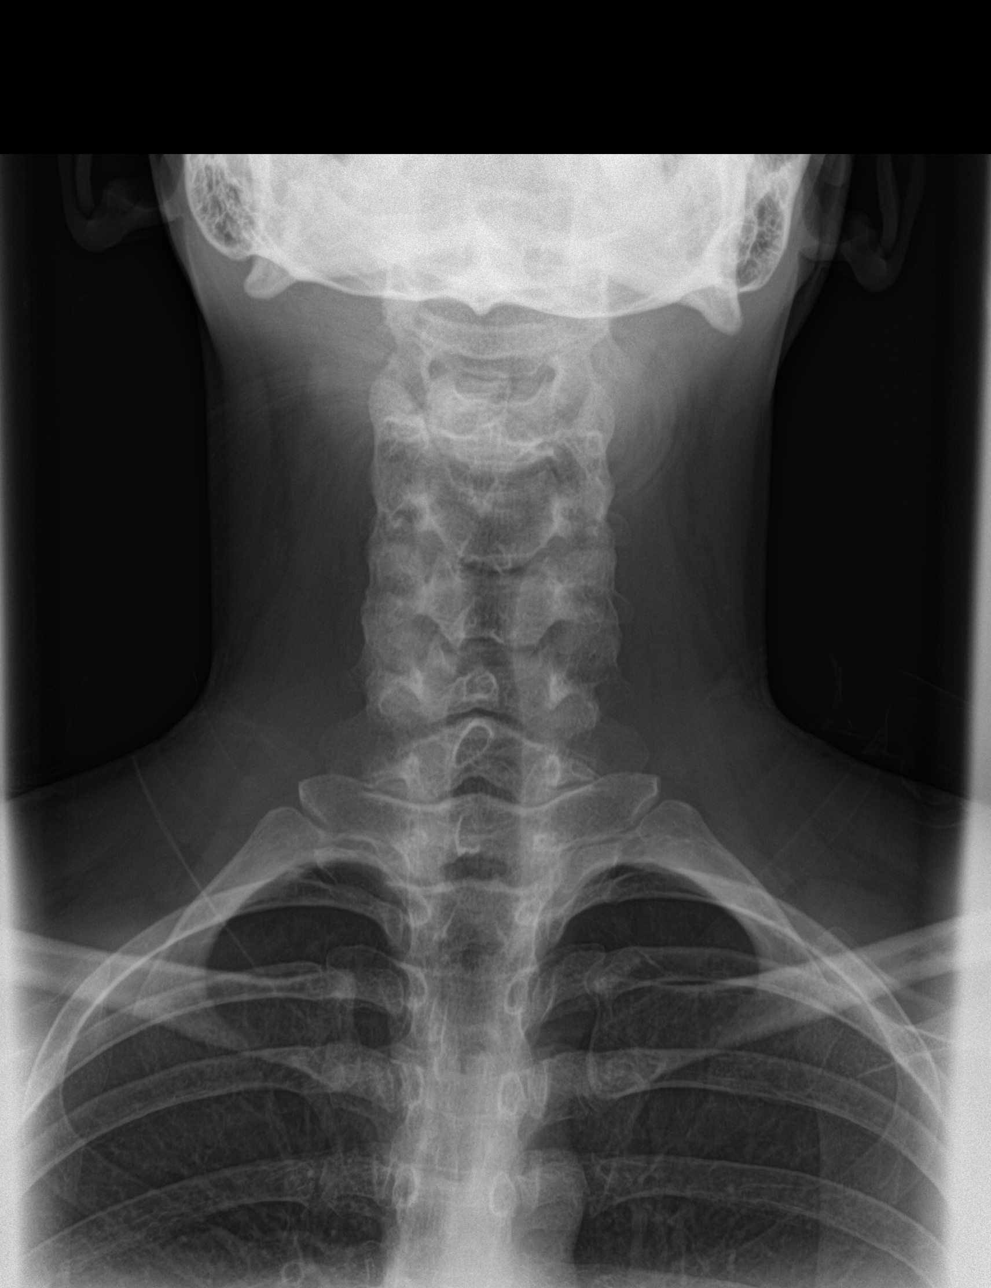

[c-spine open mouth]
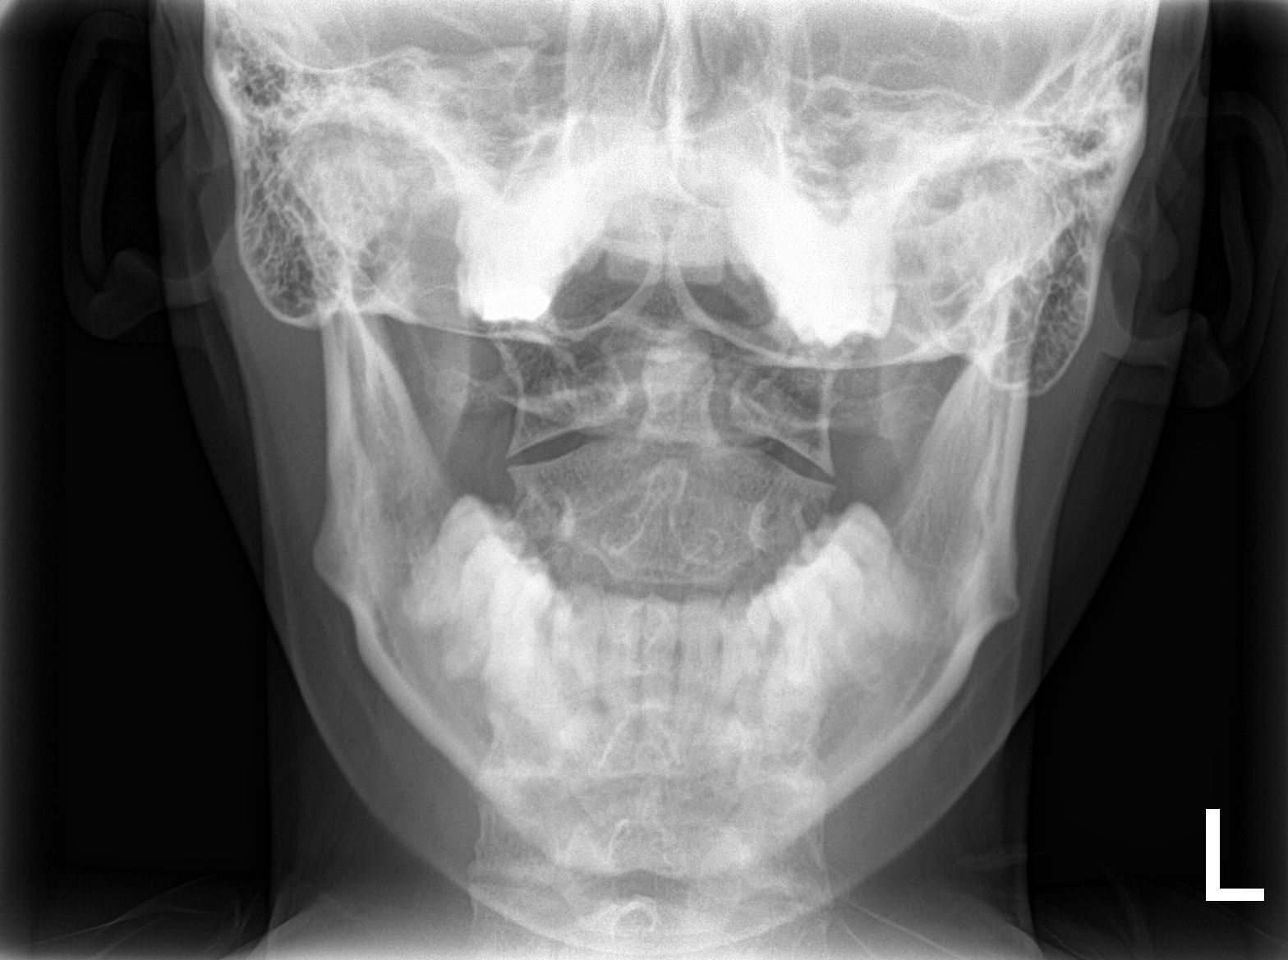

[5 of 5 positions shown; findings below may reference images not displayed]

FINDINGS: Seven cervical segments are well visualized. Vertebral body height
is well maintained. No acute fracture or acute facet abnormality is
noted. The odontoid is within normal limits. No soft tissue
abnormality is seen.
IMPRESSION: No acute abnormality noted.

## 2017-10-14 ENCOUNTER — Ambulatory Visit: Payer: 59 | Admitting: Obstetrics and Gynecology

## 2017-10-20 ENCOUNTER — Encounter: Payer: Self-pay | Admitting: Obstetrics and Gynecology

## 2017-10-20 ENCOUNTER — Ambulatory Visit (INDEPENDENT_AMBULATORY_CARE_PROVIDER_SITE_OTHER): Payer: Medicaid Other | Admitting: Obstetrics and Gynecology

## 2017-10-20 DIAGNOSIS — Z30011 Encounter for initial prescription of contraceptive pills: Secondary | ICD-10-CM

## 2017-10-20 MED ORDER — NORETHINDRONE 0.35 MG PO TABS: 1.0000 | ORAL_TABLET | Freq: Every day | ORAL | 4 refills | Status: DC

## 2017-10-20 NOTE — Progress Notes (Signed)
Postpartum Visit  Chief Complaint:  Chief Complaint  Patient presents with  . 6 week post partum    History of Present Illness: Patient is a 32 y.o. [id number] presents for postpartum visit.  Date of delivery: 09/02/2017 Type of delivery: C-Section Episiotomy No.  Laceration: no Pregnancy or labor problems:  no Any problems since the delivery:  No   Newborn Details:  SINGLETON : 1. Baby's name: Thomasenia Sales. Birth weight: 6.13 Maternal Details:  Breast Feeding:  BREAST and BOTTLE Post partum depression/anxiety noted:  A little anxiety Edinburgh Post-Partum Depression Score:  8  Date of last PAP: 02/11/2017/NORMAL  Past Medical History:  Diagnosis Date  . Anxiety   . Anxiety and depression   . Complication of anesthesia    had difficult time placing epidural  . Depression   . Gestational diabetes   . Migraine without aura   . Pneumonia     Past Surgical History:  Procedure Laterality Date  . CESAREAN SECTION  2016  . CESAREAN SECTION N/A 09/02/2017   Procedure: CESAREAN SECTION;  Surgeon: Conard Novak, MD;  Location: [facility] ORS;  Service: Obstetrics;  Laterality: N/A;  . WISDOM TOOTH EXTRACTION  2014    Prior to Admission medications   Medication Sig Start Date End Date Taking? Authorizing Provider  acetaminophen (TYLENOL) 500 MG tablet Take 1,000 mg by mouth daily as needed for moderate pain or headache.    [provider]  ferrous sulfate (FERROUSUL) 325 (65 FE) MG tablet Take 1 tablet (325 mg total) by mouth 2 (two) times daily. Patient not taking: Reported on 09/08/2017 07/01/17   Natale Milch, MD  ibuprofen (ADVIL,MOTRIN) 600 MG tablet Take 1 tablet (600 mg total) by mouth every 6 (six) hours. Patient not taking: Reported on 09/08/2017 09/04/17   Conard Novak, MD  oxyCODONE-acetaminophen (PERCOCET/ROXICET) 5-325 MG tablet Take 1 tablet by mouth every 6 (six) hours as needed (breakthrough pain). 09/04/17   Conard Novak, MD  Prenatal Vit-Fe  Fumarate-FA (PRENATAL PO) Take 1 tablet by mouth daily.    [provider]  sodium chloride (OCEAN) 0.65 % SOLN nasal spray Place 1 spray into both nostrils as needed for congestion.    [provider]    Allergies  Allergen Reactions  . Penicillins     Tongue swelling Has patient had a PCN reaction causing immediate rash, facial/tongue/throat swelling, SOB or lightheadedness with hypotension: Yes Has patient had a PCN reaction causing severe rash involving mucus membranes or skin necrosis: No Has patient had a PCN reaction that required hospitalization: Unknown Has patient had a PCN reaction occurring within the last 10 years: No If all of the above answers are "NO", then may proceed with Cephalosporin use.   . Sulfa Antibiotics     Swollen tongue   . Benadryl [Diphenhydramine Hcl (Sleep)] Rash     Social History   Socioeconomic History  . Marital status: Married    Spouse name: Clovis Riley  . Number of children: 1  . Years of education: Not on file  . Highest education level: Not on file  Occupational History  . Not on file  Social Needs  . Financial resource strain: Not on file  . Food insecurity:    Worry: Never true    Inability: Never true  . Transportation needs:    Medical: No    Non-medical: No  Tobacco Use  . Smoking status: Former Smoker    Packs/day: 0.50    Years: 12.00  Pack years: 6.00    Types: Cigarettes  . Smokeless tobacco: Never Used  Substance and Sexual Activity  . Alcohol use: No  . Drug use: Not Currently    Types: Marijuana  . Sexual activity: Yes    Birth control/protection: Condom    Comment: depo provera  Lifestyle  . Physical activity:    Days per week: Not on file    Minutes per session: Not on file  . Stress: Not on file  Relationships  . Social connections:    Talks on phone: Not on file    Gets together: Not on file    Attends religious service: Not on file    Active member of club or organization: Not on  file    Attends meetings of clubs or organizations: Not on file    Relationship status: Not on file  . Intimate partner violence:    Fear of current or ex partner: Not on file    Emotionally abused: Not on file    Physically abused: Not on file    Forced sexual activity: Not on file  Other Topics Concern  . Not on file  Social History Narrative   Married 2015   1 child   Artist- likes to draw    Family History  Problem Relation Age of Onset  . Hypertension Father   . Stroke Father     Review of Systems  Constitutional: Negative.   HENT: Negative.   Eyes: Negative.   Respiratory: Negative.   Cardiovascular: Negative.   Gastrointestinal: Negative.   Genitourinary: Negative.   Musculoskeletal: Negative.   Skin: Negative.   Neurological: Negative.   Psychiatric/Behavioral: Negative.      Physical Exam BP 114/74   Ht 5\' 4"  (1.626 m)   Wt 144 lb (65.3 kg)   BMI 24.72 kg/m   Physical Exam  Constitutional: She is oriented to person, place, and time. She appears well-developed and well-nourished. No distress.  Genitourinary: Vagina normal and uterus normal. Pelvic exam was performed with patient supine. There is no rash, tenderness or lesion on the right labia. There is no rash, tenderness or lesion on the left labia. Right adnexum does not display mass, does not display tenderness and does not display fullness. Left adnexum does not display mass, does not display tenderness and does not display fullness. Cervix does not exhibit motion tenderness, lesion or polyp.  Eyes: EOM are normal. No scleral icterus.  Neck: Normal range of motion. Neck supple.  Cardiovascular: Normal rate and regular rhythm.  Pulmonary/Chest: Effort normal and breath sounds normal. No respiratory distress. She has no wheezes. She has no rales.  Abdominal: Soft. Bowel sounds are normal. She exhibits no distension and no mass. There is no tenderness. There is no rebound and no guarding.     Musculoskeletal: Normal range of motion. She exhibits no edema.  Neurological: She is alert and oriented to person, place, and time. No cranial nerve deficit.  Skin: Skin is warm and dry. No erythema.  Psychiatric: She has a normal mood and affect. Her behavior is normal. Judgment normal.     Female Chaperone present during breast and/or pelvic exam.  Assessment: 32 y.o. [id number] presenting for 6 week postpartum visit  Plan: Problem List Items Addressed This Visit    None    Visit Diagnoses    Postpartum care and examination    -  Primary   Relevant Medications   norethindrone (MICRONOR,CAMILA,ERRIN) 0.35 MG tablet   Encounter for initial prescription  of contraceptive pills       Relevant Medications   norethindrone (MICRONOR,CAMILA,ERRIN) 0.35 MG tablet     1) Contraception Education given regarding options for contraception, including oral contraceptives.  2)  Pap - ASCCP guidelines and rational discussed.  Patient opts for routine screening interval  3) Patient underwent screening for postpartum depression with no concerns noted.  4) Follow up 1 year for routine annual exam  Thomasene Mohair, MD 10/20/2017 5:01 PM

## 2018-01-21 ENCOUNTER — Telehealth: Payer: Self-pay

## 2018-01-21 NOTE — Telephone Encounter (Signed)
Pt is finished breast feeding & is ready to switch birth control. Cb#[phone].

## 2018-01-27 NOTE — Telephone Encounter (Signed)
Would you mind calling this patient and asking which birth control she would like to start? I will send it in for her.

## 2018-01-27 NOTE — Telephone Encounter (Signed)
Lm with pt to call back and let us know which OCP she would like.

## 2018-01-28 NOTE — Telephone Encounter (Signed)
Trying to call pt again. Please let us know when she calls back. Need to know which OCP she is wanting.

## 2018-01-28 NOTE — Telephone Encounter (Signed)
Pt states SDJ told her she could switch off the pill shes on once she stops breast feeding. She said whatever he thinks she should be on is fine. Let me know once sent in and ill call pt to let her know. Thank you . She is aware you are off today

## 2018-01-31 ENCOUNTER — Other Ambulatory Visit: Payer: Self-pay | Admitting: Obstetrics and Gynecology

## 2018-02-01 ENCOUNTER — Telehealth: Payer: Self-pay | Admitting: Obstetrics and Gynecology

## 2018-02-01 NOTE — Telephone Encounter (Signed)
Forwarding to Sara

## 2018-02-01 NOTE — Telephone Encounter (Signed)
Patient is returning missed call from SDJ . Please advise °

## 2018-02-01 NOTE — Telephone Encounter (Signed)
Left generic VM 

## 2018-02-02 NOTE — Telephone Encounter (Signed)
Discussed that I do not want her to be on an estrogen-containing form of birth control. Discussed progesterone-only options and non-hormonal options. She will consider and let me know.

## 2018-03-10 ENCOUNTER — Telehealth: Payer: Self-pay

## 2018-03-10 NOTE — Telephone Encounter (Signed)
Pt needs to be seen by a provider.

## 2018-03-10 NOTE — Telephone Encounter (Signed)
Pt has blisters inside of legs; they keep getting bigger; they look like burn blisters; place a band-aid on them but they just get bigger; since Tues.  [phone]

## 2018-03-11 ENCOUNTER — Ambulatory Visit (INDEPENDENT_AMBULATORY_CARE_PROVIDER_SITE_OTHER): Payer: 59 | Admitting: Obstetrics & Gynecology

## 2018-03-11 ENCOUNTER — Encounter: Payer: Self-pay | Admitting: Obstetrics & Gynecology

## 2018-03-11 ENCOUNTER — Ambulatory Visit: Payer: Medicaid Other | Admitting: Obstetrics & Gynecology

## 2018-03-11 VITALS — BP 110/80 | Ht 64.0 in | Wt 140.0 lb

## 2018-03-11 DIAGNOSIS — T148XXA Other injury of unspecified body region, initial encounter: Secondary | ICD-10-CM

## 2018-03-11 MED ORDER — CEPHALEXIN 500 MG PO CAPS: 500.0000 mg | ORAL_CAPSULE | Freq: Four times a day (QID) | ORAL | 2 refills | Status: DC

## 2018-03-11 MED ORDER — FLUCONAZOLE 150 MG PO TABS: 150.0000 mg | ORAL_TABLET | Freq: Once | ORAL | 3 refills | Status: AC

## 2018-03-11 NOTE — Progress Notes (Signed)
HPI:      [name] is a 33 y.o. [phone], Patient's last menstrual period was 02/25/2018., presents today for a problem visit.  She complains of:  Vulvar concern:   This is a 33 y.o. old Caucasian/White female who presents for the evaluation of 2 DAY h/o BLISTER first on MONS, then a second, then one on thigh; min T but larger and blister like with white to clear fluid to drain from one of them. No h/o HSV.  Neither does husband, only partner (life). No vulvar or vaginal lesions.  PMHx: She  has a past medical history of Anxiety, Anxiety and depression, Complication of anesthesia, Depression, Gestational diabetes, Migraine without aura, and Pneumonia. Also,  has a past surgical history that includes Wisdom tooth extraction (2014); Cesarean section (2016); and Cesarean section (N/A, 09/02/2017)., family history includes Hypertension in her father; Stroke in her father.,  reports that she has quit smoking. Her smoking use included cigarettes. She has a 6.00 pack-year smoking history. She has never used smokeless tobacco. She reports previous drug use. Drug: Marijuana. She reports that she does not drink alcohol.  She has a current medication list which includes the following prescription(s): norethindrone, acetaminophen, cephalexin, ferrous sulfate, fluconazole, ibuprofen, oxycodone-acetaminophen, prenatal vit-fe fumarate-fa, and sodium chloride. Also, is allergic to penicillins; sulfa antibiotics; and benadryl [diphenhydramine hcl (sleep)].  Review of Systems  Constitutional: Negative for chills, fever and malaise/fatigue.  HENT: Negative for congestion, sinus pain and sore throat.   Eyes: Negative for blurred vision and pain.  Respiratory: Negative for cough and wheezing.   Cardiovascular: Negative for chest pain and leg swelling.  Gastrointestinal: Negative for abdominal pain, constipation, diarrhea, heartburn, nausea and vomiting.  Genitourinary: Negative for dysuria, frequency,  hematuria and urgency.  Musculoskeletal: Negative for back pain, joint pain, myalgias and neck pain.  Skin: Negative for itching and rash.  Neurological: Negative for dizziness, tremors and weakness.  Endo/Heme/Allergies: Does not bruise/bleed easily.  Psychiatric/Behavioral: Negative for depression. The patient is not nervous/anxious and does not have insomnia.    Objective: BP 110/80   Ht 5\' 4"  (1.626 m)   Wt 140 lb (63.5 kg)   LMP 02/25/2018   BMI 24.03 kg/m  Physical Exam Constitutional:      General: She is not in acute distress.    Appearance: She is well-developed.  Genitourinary:     Pelvic exam was performed with patient supine.     Vagina and uterus normal.     No vaginal erythema or bleeding.     No cervical motion tenderness, discharge, polyp or nabothian cyst.     Uterus is mobile.     Uterus is not enlarged.     No uterine mass detected.    Uterus is midaxial.     No right or left adnexal mass present.     Right adnexa not tender.     Left adnexa not tender.     Genitourinary Comments: Blister lesions one on mons one on right inner thigh, easy to rupture and drain clear to white fluid, min T, erythematous base  HENT:     Head: Normocephalic and atraumatic.     Nose: Nose normal.  Abdominal:     General: There is no distension.     Palpations: Abdomen is soft.     Tenderness: There is no abdominal tenderness.  Musculoskeletal: Normal range of motion.  Neurological:     Mental Status: She is alert and oriented to person, place, and time.  Cranial Nerves: No cranial nerve deficit.  Skin:    General: Skin is warm and dry.   ASSESSMENT/PLAN:     Blister    -  Primary   Relevant Orders   Herpes simplex virus culture (unlikely dx but will test to rule out)    Keflex for hidradenitis and for secondary bacterial infection prevention Diflucan as prevention for secondary yeast infection Dermatology if more blisters on skin occur  Alexis Major, MD,  [facility], [facility] 03/11/2018  11:37 AM

## 2018-03-11 NOTE — Telephone Encounter (Signed)
Patient is schedule with [facility] at 11:00 today

## 2018-03-11 NOTE — Patient Instructions (Signed)
Cephalexin tablets or capsules  What is this medicine?  CEPHALEXIN (sef a LEX in) is a cephalosporin antibiotic. It is used to treat certain kinds of bacterial infections It will not work for colds, flu, or other viral infections.  This medicine may be used for other purposes; ask your health care provider or pharmacist if you have questions.  COMMON BRAND NAME(S): Biocef, Daxbia, Keflex, Keftab  What should I tell my health care provider before I take this medicine?  They need to know if you have any of these conditions:  -kidney disease  -stomach or intestine problems, especially colitis  -an unusual or allergic reaction to cephalexin, other cephalosporins, penicillins, other antibiotics, medicines, foods, dyes or preservatives  -pregnant or trying to get pregnant  -breast-feeding  How should I use this medicine?  Take this medicine by mouth with a full glass of water. Follow the directions on the prescription label. This medicine can be taken with or without food. Take your medicine at regular intervals. Do not take your medicine more often than directed. Take all of your medicine as directed even if you think you are better. Do not skip doses or stop your medicine early.  Talk to your pediatrician regarding the use of this medicine in children. While this drug may be prescribed for selected conditions, precautions do apply.  Overdosage: If you think you have taken too much of this medicine contact a poison control center or emergency room at once.  NOTE: This medicine is only for you. Do not share this medicine with others.  What if I miss a dose?  If you miss a dose, take it as soon as you can. If it is almost time for your next dose, take only that dose. Do not take double or extra doses. There should be at least 4 to 6 hours between doses.  What may interact with this medicine?  -probenecid  -some other antibiotics  This list may not describe all possible interactions. Give your health care provider a list of  all the medicines, herbs, non-prescription drugs, or dietary supplements you use. Also tell them if you smoke, drink alcohol, or use illegal drugs. Some items may interact with your medicine.  What should I watch for while using this medicine?  Tell your doctor or health care professional if your symptoms do not begin to improve in a few days.  Do not treat diarrhea with over the counter products. Contact your doctor if you have diarrhea that lasts more than 2 days or if it is severe and watery.  If you have diabetes, you may get a false-positive result for sugar in your urine. Check with your doctor or health care professional.  What side effects may I notice from receiving this medicine?  Side effects that you should report to your doctor or health care professional as soon as possible:  -allergic reactions like skin rash, itching or hives, swelling of the face, lips, or tongue  -breathing problems  -pain or trouble passing urine  -redness, blistering, peeling or loosening of the skin, including inside the mouth  -severe or watery diarrhea  -unusually weak or tired  -yellowing of the eyes, skin  Side effects that usually do not require medical attention (report to your doctor or health care professional if they continue or are bothersome):  -gas or heartburn  -genital or anal irritation  -headache  -joint or muscle pain  -nausea, vomiting  This list may not describe all possible side effects. Call   your doctor for medical advice about side effects. You may report side effects to FDA at 1-800-FDA-1088.  Where should I keep my medicine?  Keep out of the reach of children.  Store at room temperature between 59 and 86 degrees F (15 and 30 degrees C). Throw away any unused medicine after the expiration date.  NOTE: This sheet is a summary. It may not cover all possible information. If you have questions about this medicine, talk to your doctor, pharmacist, or health care provider.   2019 Elsevier/Gold Standard  (2007-04-18 17:09:13)

## 2018-03-16 LAB — HERPES SIMPLEX VIRUS CULTURE

## 2018-03-18 ENCOUNTER — Encounter: Payer: Self-pay | Admitting: Obstetrics & Gynecology

## 2018-11-17 ENCOUNTER — Other Ambulatory Visit: Payer: Self-pay | Admitting: Obstetrics and Gynecology

## 2018-11-17 DIAGNOSIS — Z30011 Encounter for initial prescription of contraceptive pills: Secondary | ICD-10-CM

## 2018-11-23 ENCOUNTER — Telehealth: Payer: Self-pay

## 2018-11-23 DIAGNOSIS — Z30011 Encounter for initial prescription of contraceptive pills: Secondary | ICD-10-CM

## 2018-11-23 MED ORDER — NORETHINDRONE 0.35 MG PO TABS: 1.0000 | ORAL_TABLET | Freq: Every day | ORAL | 0 refills | Status: DC

## 2018-11-23 NOTE — Telephone Encounter (Signed)
Patient has apt 12/16/2018 w/SDJ for AE. She is out of birth control and requesting refill. [place]

## 2018-11-23 NOTE — Telephone Encounter (Signed)
Spoke w/patient. Advised 1 refill sent.

## 2018-12-16 ENCOUNTER — Ambulatory Visit: Payer: 59 | Admitting: Obstetrics and Gynecology

## 2018-12-30 ENCOUNTER — Ambulatory Visit (INDEPENDENT_AMBULATORY_CARE_PROVIDER_SITE_OTHER): Payer: 59 | Admitting: Obstetrics and Gynecology

## 2018-12-30 ENCOUNTER — Encounter: Payer: Self-pay | Admitting: Obstetrics and Gynecology

## 2018-12-30 ENCOUNTER — Other Ambulatory Visit: Payer: Self-pay

## 2018-12-30 VITALS — BP 118/78 | Ht 64.0 in | Wt 142.0 lb

## 2018-12-30 DIAGNOSIS — Z1339 Encounter for screening examination for other mental health and behavioral disorders: Secondary | ICD-10-CM

## 2018-12-30 DIAGNOSIS — Z01419 Encounter for gynecological examination (general) (routine) without abnormal findings: Secondary | ICD-10-CM | POA: Diagnosis not present

## 2018-12-30 DIAGNOSIS — Z1331 Encounter for screening for depression: Secondary | ICD-10-CM

## 2018-12-30 DIAGNOSIS — Z3041 Encounter for surveillance of contraceptive pills: Secondary | ICD-10-CM

## 2018-12-30 MED ORDER — NORETHINDRONE 0.35 MG PO TABS: 1.0000 | ORAL_TABLET | Freq: Every day | ORAL | 4 refills | Status: DC

## 2018-12-30 NOTE — Progress Notes (Signed)
Gynecology Annual Exam  PCP: Joaquim Namuncan, Graham S, MD  Chief Complaint  Patient presents with  . Annual Exam   History of Present Illness:  [name] is a 33 y.o. [id number] who LMP was Patient's last menstrual period was 12/29/2018., presents today for her annual examination.  Her menses are a little irregular. Sometimes they will come on monthly. Some months she'll have them a couple of weeks apart, others her periods will be 5 weeks apart. They last from 4-7 days.  Sometimes her periods are very heavy.      She is sexually active. She reports no issues with intercourse.  Last Pap: 2 years  Results were: no abnormalities /neg HPV DNA negative. Hx of STDs: none  There is no FH of breast cancer. There is no FH of ovarian cancer. The patient does not do self-breast exams.  Tobacco use: she smokes 1/2 ppd depending on her stress level. Alcohol use: social drinker Exercise: moderately active  The patient wears seatbelts: yes.   The patient reports that domestic violence in her life is absent.   Past Medical History:  Diagnosis Date  . Anxiety   . Anxiety and depression   . Complication of anesthesia    had difficult time placing epidural  . Depression   . Gestational diabetes   . Migraine without aura   . Pneumonia     Past Surgical History:  Procedure Laterality Date  . CESAREAN SECTION  2016  . CESAREAN SECTION N/A 09/02/2017   Procedure: CESAREAN SECTION;  Surgeon: Conard NovakJackson, Judd Mccubbin D, MD;  Location: [facility] ORS;  Service: Obstetrics;  Laterality: N/A;  . WISDOM TOOTH EXTRACTION  2014    Prior to Admission medications   Medication Sig Start Date End Date Taking? Authorizing Provider  norethindrone (MICRONOR) 0.35 MG tablet Take 1 tablet (0.35 mg total) by mouth daily. 11/23/18  Yes Conard NovakJackson, Reann Dobias D, MD  acetaminophen (TYLENOL) 500 MG tablet Take 1,000 mg by mouth daily as needed for moderate pain or headache.    [provider]  Prenatal Vit-Fe Fumarate-FA  (PRENATAL PO) Take 1 tablet by mouth daily.    [provider]  sodium chloride (OCEAN) 0.65 % SOLN nasal spray Place 1 spray into both nostrils as needed for congestion.    [provider]    Allergies  Allergen Reactions  . Penicillins     Tongue swelling Has patient had a PCN reaction causing immediate rash, facial/tongue/throat swelling, SOB or lightheadedness with hypotension: Yes Has patient had a PCN reaction causing severe rash involving mucus membranes or skin necrosis: No Has patient had a PCN reaction that required hospitalization: Unknown Has patient had a PCN reaction occurring within the last 10 years: No If all of the above answers are "NO", then may proceed with Cephalosporin use.   . Sulfa Antibiotics     Swollen tongue   . Benadryl [Diphenhydramine Hcl (Sleep)] Rash   Obstetric History: [id number]  Social History   Socioeconomic History  . Marital status: Married    Spouse name: Alexis Petersen  . Number of children: 1  . Years of education: Not on file  . Highest education level: Not on file  Occupational History  . Not on file  Social Needs  . Financial resource strain: Not on file  . Food insecurity    Worry: Never true    Inability: Never true  . Transportation needs    Medical: No    Non-medical: No  Tobacco Use  .  Smoking status: Former Smoker    Packs/day: 0.50    Years: 12.00    Pack years: 6.00    Types: Cigarettes  . Smokeless tobacco: Never Used  Substance and Sexual Activity  . Alcohol use: No  . Drug use: Not Currently    Types: Marijuana  . Sexual activity: Yes    Birth control/protection: Pill    Comment: depo provera  Lifestyle  . Physical activity    Days per week: Not on file    Minutes per session: Not on file  . Stress: Not on file  Relationships  . Social Herbalist on phone: Not on file    Gets together: Not on file    Attends religious service: Not on file    Active member of club or organization:  Not on file    Attends meetings of clubs or organizations: Not on file    Relationship status: Not on file  . Intimate partner violence    Fear of current or ex partner: Not on file    Emotionally abused: Not on file    Physically abused: Not on file    Forced sexual activity: Not on file  Other Topics Concern  . Not on file  Social History Narrative   Married 2015   1 child   Artist- likes to draw    Family History  Problem Relation Age of Onset  . Hypertension Father   . Stroke Father     Review of Systems  Constitutional: Negative.   HENT: Negative.   Eyes: Negative.   Respiratory: Negative.   Cardiovascular: Negative.   Gastrointestinal: Negative.   Genitourinary: Negative.   Musculoskeletal: Positive for joint pain. Negative for back pain, falls, myalgias and neck pain.  Skin: Negative.   Neurological: Positive for headaches. Negative for dizziness, tingling, tremors, sensory change, speech change, focal weakness, seizures and weakness.  Endo/Heme/Allergies: Positive for environmental allergies. Negative for polydipsia. Does not bruise/bleed easily.  Psychiatric/Behavioral: Negative.      Physical Exam BP 118/78   Ht 5\' 4"  (1.626 m)   Wt 142 lb (64.4 kg)   LMP 12/29/2018   BMI 24.37 kg/m    Physical Exam Constitutional:      General: She is not in acute distress.    Appearance: Normal appearance. She is well-developed.  Genitourinary:     Pelvic exam was performed with patient in the lithotomy position.     Vulva, urethra, bladder and uterus normal.     No inguinal adenopathy present in the right or left side.       No signs of injury in the vagina.     No vaginal discharge, erythema, tenderness or bleeding.     No cervical motion tenderness, discharge, lesion or polyp.     Uterus is mobile.     Uterus is not enlarged or tender.     No uterine mass detected.    Uterus is anteverted.     No right or left adnexal mass present.     Right adnexa not  tender or full.     Left adnexa not tender or full.  HENT:     Head: Normocephalic and atraumatic.  Eyes:     General: No scleral icterus.    Conjunctiva/sclera: Conjunctivae normal.  Neck:     Musculoskeletal: Normal range of motion and neck supple.     Thyroid: No thyromegaly.  Cardiovascular:     Rate and Rhythm: Normal rate and regular  rhythm.     Heart sounds: No murmur. No friction rub. No gallop.   Pulmonary:     Effort: Pulmonary effort is normal. No respiratory distress.     Breath sounds: Normal breath sounds. No wheezing or rales.  Chest:     Breasts:        Right: No inverted nipple, mass, nipple discharge, skin change or tenderness.        Left: No inverted nipple, mass, nipple discharge, skin change or tenderness.  Abdominal:     General: Bowel sounds are normal. There is no distension.     Palpations: Abdomen is soft. There is no mass.     Tenderness: There is no abdominal tenderness. There is no guarding or rebound.  Musculoskeletal: Normal range of motion.        General: No swelling or tenderness.  Lymphadenopathy:     Cervical: No cervical adenopathy.     Lower Body: No right inguinal adenopathy. No left inguinal adenopathy.  Neurological:     General: No focal deficit present.     Mental Status: She is alert and oriented to person, place, and time.     Cranial Nerves: No cranial nerve deficit.  Skin:    General: Skin is warm and dry.     Findings: No erythema or rash.  Psychiatric:        Mood and Affect: Mood normal.        Behavior: Behavior normal.        Judgment: Judgment normal.     Female chaperone present for pelvic and breast  portions of the physical exam  Results: AUDIT Questionnaire (screen for alcoholism): 2 PHQ-9: 7   Assessment: 33 y.o. G65P2002 female here for routine annual gynecologic examination  Plan: Problem List Items Addressed This Visit    None    Visit Diagnoses    Women's annual routine gynecological examination     -  Primary   Relevant Medications   norethindrone (MICRONOR) 0.35 MG tablet   Screening for depression       Screening for alcoholism       Encounter for surveillance of contraceptive pills       Relevant Medications   norethindrone (MICRONOR) 0.35 MG tablet      Screening: -- Blood pressure screen normal -- Weight screening: normal -- Depression screening negative (PHQ-9) -- Nutrition: normal -- cholesterol screening: not due for screening -- osteoporosis screening: not due -- tobacco screening: using: discussed quitting using the 5 A's -- alcohol screening: AUDIT questionnaire indicates low-risk usage. -- family history of breast cancer screening: done. not at high risk. -- no evidence of domestic violence or intimate partner violence. -- STD screening: gonorrhea/chlamydia NAAT not collected per patient request. -- pap smear not collected per ASCCP guidelines  Patient states hymenal remnant sometimes is bothersome to her during intercourse. Discussed that it could be removed in clinic fairly easily.  She will consider.   Thomasene Mohair, MD 12/30/2018 3:19 PM

## 2020-01-26 ENCOUNTER — Other Ambulatory Visit: Payer: Self-pay | Admitting: Obstetrics and Gynecology

## 2020-01-26 DIAGNOSIS — Z01419 Encounter for gynecological examination (general) (routine) without abnormal findings: Secondary | ICD-10-CM

## 2020-01-26 DIAGNOSIS — Z3041 Encounter for surveillance of contraceptive pills: Secondary | ICD-10-CM

## 2020-02-26 ENCOUNTER — Other Ambulatory Visit: Payer: Self-pay | Admitting: Obstetrics and Gynecology

## 2020-02-26 DIAGNOSIS — Z3041 Encounter for surveillance of contraceptive pills: Secondary | ICD-10-CM

## 2020-02-26 DIAGNOSIS — Z01419 Encounter for gynecological examination (general) (routine) without abnormal findings: Secondary | ICD-10-CM

## 2020-02-27 DIAGNOSIS — U071 COVID-19: Secondary | ICD-10-CM

## 2020-02-27 HISTORY — DX: COVID-19: U07.1

## 2020-03-14 ENCOUNTER — Ambulatory Visit (INDEPENDENT_AMBULATORY_CARE_PROVIDER_SITE_OTHER): Payer: No Typology Code available for payment source

## 2020-03-14 ENCOUNTER — Ambulatory Visit
Admission: EM | Admit: 2020-03-14 | Discharge: 2020-03-14 | Disposition: A | Discharge status: Home / Self Care | Payer: No Typology Code available for payment source | Attending: Family Medicine | Admitting: Family Medicine

## 2020-03-14 ENCOUNTER — Other Ambulatory Visit: Payer: Self-pay

## 2020-03-14 DIAGNOSIS — R0602 Shortness of breath: Secondary | ICD-10-CM | POA: Diagnosis not present

## 2020-03-14 DIAGNOSIS — U071 COVID-19: Secondary | ICD-10-CM | POA: Diagnosis not present

## 2020-03-14 DIAGNOSIS — R059 Cough, unspecified: Secondary | ICD-10-CM

## 2020-03-14 MED ORDER — KETOROLAC TROMETHAMINE 10 MG PO TABS: 10.0000 mg | ORAL_TABLET | Freq: Four times a day (QID) | ORAL | 0 refills | Status: DC | PRN

## 2020-03-14 MED ORDER — PROMETHAZINE-DM 6.25-15 MG/5ML PO SYRP: 5.0000 mL | ORAL_SOLUTION | Freq: Four times a day (QID) | ORAL | 0 refills | Status: DC | PRN

## 2020-03-14 MED ORDER — ALBUTEROL SULFATE HFA 108 (90 BASE) MCG/ACT IN AERS: 1.0000 | INHALATION_SPRAY | Freq: Four times a day (QID) | RESPIRATORY_TRACT | 0 refills | Status: AC | PRN

## 2020-03-14 MED ORDER — PREDNISONE 50 MG PO TABS: ORAL_TABLET | ORAL | 0 refills | Status: DC

## 2020-03-14 NOTE — Discharge Instructions (Signed)
Medication as prescribed.  I have placed a referral for you. Be sure to have your phone handy.  Chest xray was clear.  Take care  Dr. Adriana Simas

## 2020-03-14 NOTE — ED Triage Notes (Signed)
Pt c/o COVID positive at [facility] last Thursday. Pt c/o continued headache, chest congestion/cough, weakness/body aches, diarrhea, bilateral ear pain and some wheeze/shob. Pt states no recent fevers. Pt is concerned about pna. Pt also reports decreased oral intake. Pt has been taking Advil and Tylenol with little relief in symptoms. Pt has not taken any cough medications/decongestants.

## 2020-03-14 NOTE — ED Provider Notes (Signed)
[facility]    CSN: [id number] Arrival date & time: 03/14/20  1815      History   Chief Complaint Chief Complaint  Patient presents with  . Cough  . Headache   HPI  35 year old female presents with multiple complaints.  Patient states that she has been sick since last Wednesday (2/9).  She tested positive for COVID-[facility] via [facility] on Thursday of last week.  She reports that she continues to feel poorly.  She has not improved.  She reports shortness of breath, diarrhea, productive cough, headache, body aches, dizziness, ear pain.  She feels very fatigued.  She had decreased p.o. intake.  She has used over-the-counter medication without significant improvement.  She is very concerned as she has not improving.  She is a current smoker.  Past Medical History:  Diagnosis Date  . Anxiety   . Anxiety and depression   . Complication of anesthesia    had difficult time placing epidural  . COVID-19 02/2020  . Depression   . Gestational diabetes   . Migraine without aura   . Pneumonia     Patient Active Problem List   Diagnosis Date Noted  . History of cesarean section 09/02/2017  . Paresthesia 04/14/2016  . History of gestational diabetes 04/25/2015  . Migraine without aura 04/25/2015  . Anxiety and depression 05/22/2014    Past Surgical History:  Procedure Laterality Date  . CESAREAN SECTION  2016  . CESAREAN SECTION N/A 09/02/2017   Procedure: CESAREAN SECTION;  Surgeon: Conard Novak, MD;  Location: [facility] ORS;  Service: Obstetrics;  Laterality: N/A;  . WISDOM TOOTH EXTRACTION  2014    OB History    Gravida  2   Para  2   Term  2   Preterm      AB      Living  2     SAB      IAB      Ectopic      Multiple  0   Live Births  2            Home Medications    Prior to Admission medications   Medication Sig Start Date End Date Taking? Authorizing Provider  acetaminophen (TYLENOL) 500 MG tablet Take 1,000 mg by mouth daily as needed for  moderate pain or headache.   Yes [provider]  albuterol (VENTOLIN HFA) 108 (90 Base) MCG/ACT inhaler Inhale 1-2 puffs into the lungs every 6 (six) hours as needed for wheezing or shortness of breath. 03/14/20  Yes Lidia Clavijo G, DO  ketorolac (TORADOL) 10 MG tablet Take 1 tablet (10 mg total) by mouth every 6 (six) hours as needed for moderate pain or severe pain. 03/14/20  Yes Jyasia Markoff, Verdis Frederickson, DO  norethindrone (MICRONOR) 0.35 MG tablet Take 1 tablet by mouth once daily 01/26/20  Yes Conard Novak, MD  predniSONE (DELTASONE) 50 MG tablet 1 tablet daily x 5 days 03/14/20  Yes Sharnice Bosler G, DO  promethazine-dextromethorphan (PROMETHAZINE-DM) 6.25-15 MG/5ML syrup Take 5 mLs by mouth 4 (four) times daily as needed for cough. 03/14/20  Yes Carlo Guevarra, Verdis Frederickson, DO  Prenatal Vit-Fe Fumarate-FA (PRENATAL PO) Take 1 tablet by mouth daily.    [provider]  sodium chloride (OCEAN) 0.65 % SOLN nasal spray Place 1 spray into both nostrils as needed for congestion.    [provider]  ferrous sulfate (FERROUSUL) 325 (65 FE) MG tablet Take 1 tablet (325 mg total) by mouth  2 (two) times daily. Patient not taking: No sig reported 07/01/17 03/14/20  Natale Milch, MD    Family History Family History  Problem Relation Age of Onset  . Hypertension Father   . Stroke Father     Social History Social History   Tobacco Use  . Smoking status: Former Smoker    Packs/day: 0.50    Years: 12.00    Pack years: 6.00    Types: Cigarettes  . Smokeless tobacco: Never Used  Vaping Use  . Vaping Use: Former  Substance Use Topics  . Alcohol use: No  . Drug use: Not Currently    Types: Marijuana     Allergies   Penicillins, Sulfa antibiotics, and Benadryl [diphenhydramine hcl (sleep)]   Review of Systems Review of Systems Per HPI  Physical Exam Triage Vital Signs ED Triage Vitals  Enc Vitals Group     BP 03/14/20 1832 117/89     Pulse Rate 03/14/20 1832 80     Resp  03/14/20 1832 18     Temp 03/14/20 1832 98.6 F (37 C)     Temp Source 03/14/20 1832 Oral     SpO2 03/14/20 1832 100 %     Weight 03/14/20 1828 138 lb (62.6 kg)     Height 03/14/20 1828 5\' 4"  (1.626 m)     Head Circumference --      Peak Flow --      Pain Score 03/14/20 1828 2     Pain Loc --      Pain Edu? --      Excl. in GC? --    Updated Vital Signs BP 117/89 (BP Location: Left Arm)   Pulse 80   Temp 98.6 F (37 C) (Oral)   Resp 18   Ht 5\' 4"  (1.626 m)   Wt 62.6 kg   LMP 03/11/2020 (Approximate)   SpO2 100%   BMI 23.69 kg/m   Visual Acuity Right Eye Distance:   Left Eye Distance:   Bilateral Distance:    Right Eye Near:   Left Eye Near:    Bilateral Near:     Physical Exam Constitutional:      Comments: Appears mildly ill.  No acute distress.  HENT:     Head: Normocephalic and atraumatic.     Mouth/Throat:     Pharynx: Oropharynx is clear. No oropharyngeal exudate or posterior oropharyngeal erythema.  Eyes:     General:        Right eye: No discharge.        Left eye: No discharge.     Conjunctiva/sclera: Conjunctivae normal.  Cardiovascular:     Rate and Rhythm: Normal rate and regular rhythm.     Heart sounds: No murmur heard.   Pulmonary:     Effort: Pulmonary effort is normal.     Breath sounds: Wheezing present.  Skin:    General: Skin is warm.     Findings: No rash.  Neurological:     Mental Status: She is alert.    UC Treatments / Results  Labs (all labs ordered are listed, but only abnormal results are displayed) Labs Reviewed - No data to display  EKG   Radiology DG Chest 2 View  Result Date: 03/14/2020 CLINICAL DATA:  Cough and shortness of breath. History of COVID-19 positive status EXAM: CHEST - 2 VIEW COMPARISON:  March 17, 2016 FINDINGS: Lungs are clear. Heart size and pulmonary vascularity are normal. No adenopathy. No bone lesions. IMPRESSION: Lungs clear.  Cardiac silhouette normal. Electronically Signed   By: Bretta Bang III M.D.   On: 03/14/2020 18:56    Procedures Procedures (including critical care time)  Medications Ordered in UC Medications - No data to display  Initial Impression / Assessment and Plan / UC Course  I have reviewed the triage vital signs and the nursing notes.  Pertinent labs & imaging results that were available during my care of the patient were reviewed by me and considered in my medical decision making (see chart for details).    35 year old female presents with COVID-19.  Chest x-ray was obtained given lung findings.  Chest x-ray was independently reviewed by me.  Interpretation: Normal chest x-ray.  No infiltrate.  Treating patient symptomatically with Toradol, albuterol, prednisone, Promethazine DM.  Referral placed to infusion clinic.  Final Clinical Impressions(s) / UC Diagnoses   Final diagnoses:  COVID     Discharge Instructions     Medication as prescribed.  I have placed a referral for you. Be sure to have your phone handy.  Chest xray was clear.  Take care  Dr. Adriana Simas    ED Prescriptions    Medication Sig Dispense Auth. Provider   ketorolac (TORADOL) 10 MG tablet Take 1 tablet (10 mg total) by mouth every 6 (six) hours as needed for moderate pain or severe pain. 20 tablet [facility] G, DO   albuterol (VENTOLIN HFA) 108 (90 Base) MCG/ACT inhaler Inhale 1-2 puffs into the lungs every 6 (six) hours as needed for wheezing or shortness of breath. 18 g Gelisa Tieken G, DO   predniSONE (DELTASONE) 50 MG tablet 1 tablet daily x 5 days 5 tablet Eilidh Marcano G, DO   promethazine-dextromethorphan (PROMETHAZINE-DM) 6.25-15 MG/5ML syrup Take 5 mLs by mouth 4 (four) times daily as needed for cough. 118 mL Tommie Sams, DO     PDMP not reviewed this encounter.   Tommie Sams, [place] 03/14/20 1951

## 2023-09-06 LAB — HEPATITIS C ANTIBODY: HCV Ab: NEGATIVE

## 2023-09-06 LAB — OB RESULTS CONSOLE HEPATITIS B SURFACE ANTIGEN: Hepatitis B Surface Ag: NEGATIVE

## 2023-09-06 LAB — OB RESULTS CONSOLE HIV ANTIBODY (ROUTINE TESTING): HIV: NONREACTIVE

## 2023-09-06 LAB — OB RESULTS CONSOLE RUBELLA ANTIBODY, IGM: Rubella: IMMUNE

## 2023-09-06 LAB — OB RESULTS CONSOLE VARICELLA ZOSTER ANTIBODY, IGG: Varicella: IMMUNE

## 2024-01-18 LAB — OB RESULTS CONSOLE GC/CHLAMYDIA
Chlamydia: NEGATIVE
Neisseria Gonorrhea: NEGATIVE

## 2024-01-18 LAB — OB RESULTS CONSOLE GBS: GBS: NEGATIVE

## 2024-02-01 NOTE — H&P (View-Only) (Signed)
 OB History & Physical   History of Present Illness:  Chief Complaint: here for c-section and tubal ligation  HPI:  Alexis Petersen is a 39 y.o. G24P2002 female at [redacted]w[redacted]d dated by 9 week ultrasound.  Her pregnancy has been complicated by AMA, history of c-section x 2, GDMA2 .    She denies contractions.   She denies leakage of fluid.   She denies vaginal bleeding.   She reports fetal movement.    Total weight gain for pregnancy: 16.2 kg (35 lb 12.8 oz)   Obstetrical Problem List: Pregnancy Problems (from 08/25/23 to present)     No problems associated with this episode.        Maternal Medical History:   Past Medical History:  Diagnosis Date   Abnormal TSH    AMA (advanced maternal age) multigravida 35+, first trimester (HHS-HCC)    Anxiety and depression    Blurred vision    Dependence on nicotine from cigarettes    Financial difficulties    Graves disease    History of gestational diabetes    Hx of migraines    Intractable migraine with aura with status migrainosus    Low maternal serum vitamin B12    Previous cesarean section    Stress    Supervision of high risk pregnancy in first trimester     Past Surgical History:  Procedure Laterality Date   WISDOM TOOTH EXTRACTION  2014   CESAREAN SECTION  2016   cesarean  2019    Allergies  Allergen Reactions   Diphenhydramine  Hcl Rash and Hives   Sulfa (Sulfonamide Antibiotics) Other (See Comments) and Swelling    Swollen tongue   Penicillins Other (See Comments) and Swelling    Tongue swelling  Has patient had a PCN reaction causing immediate rash, facial/tongue/throat swelling, SOB or lightheadedness with hypotension: Yes  Has patient had a PCN reaction causing severe rash involving mucus membranes or skin necrosis: No  Has patient had a PCN reaction that required hospitalization: Unknown  Has patient had a PCN reaction occurring within the last 10 years: No  If all of the above answers are NO, then may  proceed with Cephalosporin use.  Tongue blisters    Prior to Admission medications  Medication Sig Taking? Last Dose  blood glucose diagnostic test strip 1 each (1 strip total) 4 (four) times daily Use as instructed. Yes Taking  blood glucose meter kit as directed Yes Taking  cyanocobalamin (VITAMIN B12) 1000 MCG tablet Take 1,000 mcg by mouth once daily Yes Taking  ergocalciferol , vitamin D2, (VITAMIN D2 ORAL) Take by mouth Yes Taking  insulin GLARGINE (LANTUS SOLOSTAR U-100 INSULIN) pen injector (concentration 100 units/mL) Inject 10 Units subcutaneously at bedtime Yes Taking  lancets Use 1 each 4 (four) times daily Use as instructed. Yes Taking  MAGNESIUM ORAL Take 400 mg by mouth once daily Yes Taking  methIMAzole (TAPAZOLE) 10 MG tablet Take 0.5 tablets (5 mg total) by mouth once daily Yes Taking  PNV no.95/ferrous fum/folic ac (PRENATAL ORAL) Take by mouth Yes Taking  promethazine  (PHENERGAN ) 25 MG tablet Take 1 tablet (25 mg total) by mouth every 6 (six) hours as needed for Nausea 12.5-25 mg every 6 hours as need for nausea Patient not taking: Reported on 12/16/2023    propranoloL (INDERAL) 10 MG tablet Take 1 tablet (10 mg total) by mouth 2 (two) times daily Patient not taking: Reported on 12/16/2023      OB History  Gravida Para Term Preterm AB Living  3 2 2   2   SAB IAB Ectopic Molar Multiple Live Births       2    # Outcome Date GA Lbr Len/2nd Weight Sex Type Anes PTL Lv  3 Current           2 Term 09/02/17 [redacted]w[redacted]d  2.99 kg (6 lb 9.5 oz) F CS-LTranv Spinal  LIV  1 Term 03/07/14 [redacted]w[redacted]d  2.637 kg (5 lb 13 oz) M CS-LTranv   LIV     Complications: Fetal Intolerance    Prenatal care site: [facility]  Social History: She  reports that she has been smoking cigarettes. She has never used smokeless tobacco. She reports that she does not currently use alcohol. Drug use questions deferred to the physician.  Family History: family history includes Cervical cancer in her  mother; Colon cancer in her paternal aunt; Heart disease in her father; High blood pressure (Hypertension) in her mother; Hypothyroidism in her father; Stroke in her mother.   Review of Systems  Constitutional: Negative.   HENT: Negative.    Eyes: Negative.   Respiratory: Negative.    Cardiovascular: Negative.   Gastrointestinal: Negative.   Genitourinary: Negative.   Musculoskeletal: Negative.   Skin: Negative.   Neurological: Negative.   Endo/Heme/Allergies: Negative.   Psychiatric/Behavioral: Negative.       Physical Exam:  BP 118/81   Pulse 98   Ht 152.4 cm (5')   Wt 82 kg (180 lb 12.8 oz)   LMP 05/19/2023 (Approximate)   BMI 35.31 kg/m   Physical Exam Constitutional:      General: She is not in acute distress.    Appearance: Normal appearance.  HENT:     Head: Normocephalic and atraumatic.  Eyes:     General: No scleral icterus.    Conjunctiva/sclera: Conjunctivae normal.  Cardiovascular:     Rate and Rhythm: Normal rate and regular rhythm.     Heart sounds: No murmur heard.    No friction rub. No gallop.  Pulmonary:     Effort: Pulmonary effort is normal. No respiratory distress.     Breath sounds: Normal breath sounds. No wheezing, rhonchi or rales.  Abdominal:     General: Bowel sounds are normal. There is no distension.     Palpations: Abdomen is soft. There is mass (gravid, NT).     Tenderness: There is no abdominal tenderness. There is no guarding or rebound.  Musculoskeletal:        General: No swelling. Normal range of motion.  Neurological:     General: No focal deficit present.     Mental Status: She is oriented to person, place, and time.     Cranial Nerves: No cranial nerve deficit.  Skin:    General: Skin is warm and dry.     Findings: No lesion.  Psychiatric:        Mood and Affect: Mood normal.        Behavior: Behavior normal.        Judgment: Judgment normal.  Vitals and nursing note reviewed.      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 G34P2002 female at [redacted]w[redacted]d with history of c-section, desires repeat. Desires sterilization.   Plan:  Admit to Labor & Delivery  CBC, T&S, NPO, IVF GBS negative.   Consents signed and reviewed today. 39 y.o. [id number]  with undesired fertility, desires permanent sterilization.  Other reversible forms of contraception were discussed with patient; she declines all other modalities. Permanent nature of as well as associated risks of the procedure discussed with patient including but not limited to: risk of regret, permanence of method, bleeding, infection, injury to surrounding organs and need for additional procedures.  Failure risk of 0.5-1% with increased risk of ectopic gestation if pregnancy occurs was also discussed with patient.      Mehar Kirkwood DANIEL Loys Hoselton, MD 02/01/2024 5:00 PM

## 2024-02-01 NOTE — H&P (Signed)
 OB History & Physical   History of Present Illness:  Chief Complaint: here for c-section and tubal ligation  HPI:  Alexis Petersen is a 39 y.o. G24P2002 female at [redacted]w[redacted]d dated by 9 week ultrasound.  Her pregnancy has been complicated by AMA, history of c-section x 2, GDMA2 .    She denies contractions.   She denies leakage of fluid.   She denies vaginal bleeding.   She reports fetal movement.    Total weight gain for pregnancy: 16.2 kg (35 lb 12.8 oz)   Obstetrical Problem List: Pregnancy Problems (from 08/25/23 to present)     No problems associated with this episode.        Maternal Medical History:   Past Medical History:  Diagnosis Date   Abnormal TSH    AMA (advanced maternal age) multigravida 35+, first trimester (HHS-HCC)    Anxiety and depression    Blurred vision    Dependence on nicotine from cigarettes    Financial difficulties    Graves disease    History of gestational diabetes    Hx of migraines    Intractable migraine with aura with status migrainosus    Low maternal serum vitamin B12    Previous cesarean section    Stress    Supervision of high risk pregnancy in first trimester     Past Surgical History:  Procedure Laterality Date   WISDOM TOOTH EXTRACTION  2014   CESAREAN SECTION  2016   cesarean  2019    Allergies  Allergen Reactions   Diphenhydramine  Hcl Rash and Hives   Sulfa (Sulfonamide Antibiotics) Other (See Comments) and Swelling    Swollen tongue   Penicillins Other (See Comments) and Swelling    Tongue swelling  Has patient had a PCN reaction causing immediate rash, facial/tongue/throat swelling, SOB or lightheadedness with hypotension: Yes  Has patient had a PCN reaction causing severe rash involving mucus membranes or skin necrosis: No  Has patient had a PCN reaction that required hospitalization: Unknown  Has patient had a PCN reaction occurring within the last 10 years: No  If all of the above answers are NO, then may  proceed with Cephalosporin use.  Tongue blisters    Prior to Admission medications  Medication Sig Taking? Last Dose  blood glucose diagnostic test strip 1 each (1 strip total) 4 (four) times daily Use as instructed. Yes Taking  blood glucose meter kit as directed Yes Taking  cyanocobalamin (VITAMIN B12) 1000 MCG tablet Take 1,000 mcg by mouth once daily Yes Taking  ergocalciferol , vitamin D2, (VITAMIN D2 ORAL) Take by mouth Yes Taking  insulin GLARGINE (LANTUS SOLOSTAR U-100 INSULIN) pen injector (concentration 100 units/mL) Inject 10 Units subcutaneously at bedtime Yes Taking  lancets Use 1 each 4 (four) times daily Use as instructed. Yes Taking  MAGNESIUM ORAL Take 400 mg by mouth once daily Yes Taking  methIMAzole (TAPAZOLE) 10 MG tablet Take 0.5 tablets (5 mg total) by mouth once daily Yes Taking  PNV no.95/ferrous fum/folic ac (PRENATAL ORAL) Take by mouth Yes Taking  promethazine  (PHENERGAN ) 25 MG tablet Take 1 tablet (25 mg total) by mouth every 6 (six) hours as needed for Nausea 12.5-25 mg every 6 hours as need for nausea Patient not taking: Reported on 12/16/2023    propranoloL (INDERAL) 10 MG tablet Take 1 tablet (10 mg total) by mouth 2 (two) times daily Patient not taking: Reported on 12/16/2023      OB History  Gravida Para Term Preterm AB Living  3 2 2   2   SAB IAB Ectopic Molar Multiple Live Births       2    # Outcome Date GA Lbr Len/2nd Weight Sex Type Anes PTL Lv  3 Current           2 Term 09/02/17 [redacted]w[redacted]d  2.99 kg (6 lb 9.5 oz) F CS-LTranv Spinal  LIV  1 Term 03/07/14 [redacted]w[redacted]d  2.637 kg (5 lb 13 oz) M CS-LTranv   LIV     Complications: Fetal Intolerance    Prenatal care site: [facility]  Social History: She  reports that she has been smoking cigarettes. She has never used smokeless tobacco. She reports that she does not currently use alcohol. Drug use questions deferred to the physician.  Family History: family history includes Cervical cancer in her  mother; Colon cancer in her paternal aunt; Heart disease in her father; High blood pressure (Hypertension) in her mother; Hypothyroidism in her father; Stroke in her mother.   Review of Systems  Constitutional: Negative.   HENT: Negative.    Eyes: Negative.   Respiratory: Negative.    Cardiovascular: Negative.   Gastrointestinal: Negative.   Genitourinary: Negative.   Musculoskeletal: Negative.   Skin: Negative.   Neurological: Negative.   Endo/Heme/Allergies: Negative.   Psychiatric/Behavioral: Negative.       Physical Exam:  BP 118/81   Pulse 98   Ht 152.4 cm (5')   Wt 82 kg (180 lb 12.8 oz)   LMP 05/19/2023 (Approximate)   BMI 35.31 kg/m   Physical Exam Constitutional:      General: She is not in acute distress.    Appearance: Normal appearance.  HENT:     Head: Normocephalic and atraumatic.  Eyes:     General: No scleral icterus.    Conjunctiva/sclera: Conjunctivae normal.  Cardiovascular:     Rate and Rhythm: Normal rate and regular rhythm.     Heart sounds: No murmur heard.    No friction rub. No gallop.  Pulmonary:     Effort: Pulmonary effort is normal. No respiratory distress.     Breath sounds: Normal breath sounds. No wheezing, rhonchi or rales.  Abdominal:     General: Bowel sounds are normal. There is no distension.     Palpations: Abdomen is soft. There is mass (gravid, NT).     Tenderness: There is no abdominal tenderness. There is no guarding or rebound.  Musculoskeletal:        General: No swelling. Normal range of motion.  Neurological:     General: No focal deficit present.     Mental Status: She is oriented to person, place, and time.     Cranial Nerves: No cranial nerve deficit.  Skin:    General: Skin is warm and dry.     Findings: No lesion.  Psychiatric:        Mood and Affect: Mood normal.        Behavior: Behavior normal.        Judgment: Judgment normal.  Vitals and nursing note reviewed.      Pertinent Results:  Prenatal  Labs Blood type/Rh A negative  Antibody screen negative  Rubella Immune  Varicella Immune    RPR NR (09/06/2023, 12/01/2023)  HBsAg negative  Hep C neg  HIV negative (09/06/2023, 12/01/2023)  GC negative  Chlamydia negative  Genetic screening Diploid XY  1 hour GTT GDM  3 hour GTT GDM  GBS negative on 01/18/2024    Assessment:  Lizanne Erker is a 39 y.o. G34P2002 female at [redacted]w[redacted]d with history of c-section, desires repeat. Desires sterilization.   Plan:  Admit to Labor & Delivery  CBC, T&S, NPO, IVF GBS negative.   Consents signed and reviewed today. 39 y.o. [id number]  with undesired fertility, desires permanent sterilization.  Other reversible forms of contraception were discussed with patient; she declines all other modalities. Permanent nature of as well as associated risks of the procedure discussed with patient including but not limited to: risk of regret, permanence of method, bleeding, infection, injury to surrounding organs and need for additional procedures.  Failure risk of 0.5-1% with increased risk of ectopic gestation if pregnancy occurs was also discussed with patient.      Mehar Kirkwood DANIEL Loys Hoselton, MD 02/01/2024 5:00 PM

## 2024-02-02 ENCOUNTER — Encounter: Payer: Self-pay | Admitting: Obstetrics and Gynecology

## 2024-02-02 ENCOUNTER — Inpatient Hospital Stay: Admitting: General Practice

## 2024-02-02 ENCOUNTER — Other Ambulatory Visit: Payer: Self-pay

## 2024-02-02 ENCOUNTER — Encounter
Admission: EM | Disposition: A | Discharge status: Home / Self Care | Payer: Self-pay | Source: Home / Self Care | Attending: Certified Nurse Midwife

## 2024-02-02 ENCOUNTER — Inpatient Hospital Stay
Admission: EM | Admit: 2024-02-02 | Discharge: 2024-02-04 | DRG: 784 | Disposition: A | Discharge status: Home / Self Care | Attending: Certified Nurse Midwife | Admitting: Certified Nurse Midwife

## 2024-02-02 DIAGNOSIS — Z302 Encounter for sterilization: Secondary | ICD-10-CM

## 2024-02-02 DIAGNOSIS — O9081 Anemia of the puerperium: Secondary | ICD-10-CM | POA: Diagnosis not present

## 2024-02-02 DIAGNOSIS — Z8249 Family history of ischemic heart disease and other diseases of the circulatory system: Secondary | ICD-10-CM

## 2024-02-02 DIAGNOSIS — Z98891 History of uterine scar from previous surgery: Principal | ICD-10-CM

## 2024-02-02 DIAGNOSIS — O99334 Smoking (tobacco) complicating childbirth: Secondary | ICD-10-CM | POA: Diagnosis present

## 2024-02-02 DIAGNOSIS — F17210 Nicotine dependence, cigarettes, uncomplicated: Secondary | ICD-10-CM | POA: Diagnosis present

## 2024-02-02 DIAGNOSIS — Z3A38 38 weeks gestation of pregnancy: Secondary | ICD-10-CM | POA: Diagnosis not present

## 2024-02-02 DIAGNOSIS — D62 Acute posthemorrhagic anemia: Secondary | ICD-10-CM | POA: Diagnosis not present

## 2024-02-02 DIAGNOSIS — O24419 Gestational diabetes mellitus in pregnancy, unspecified control: Secondary | ICD-10-CM | POA: Diagnosis present

## 2024-02-02 DIAGNOSIS — Z880 Allergy status to penicillin: Secondary | ICD-10-CM

## 2024-02-02 DIAGNOSIS — O24424 Gestational diabetes mellitus in childbirth, insulin controlled: Secondary | ICD-10-CM | POA: Diagnosis present

## 2024-02-02 DIAGNOSIS — O34211 Maternal care for low transverse scar from previous cesarean delivery: Secondary | ICD-10-CM | POA: Diagnosis present

## 2024-02-02 DIAGNOSIS — O4100X0 Oligohydramnios, unspecified trimester, not applicable or unspecified: Secondary | ICD-10-CM | POA: Diagnosis present

## 2024-02-02 LAB — CBC
HCT: 36.8 % (ref 36.0–46.0)
Hemoglobin: 12.2 g/dL (ref 12.0–15.0)
MCH: 29.8 pg (ref 26.0–34.0)
MCHC: 33.2 g/dL (ref 30.0–36.0)
MCV: 90 fL (ref 80.0–100.0)
Platelets: 360 K/uL (ref 150–400)
RBC: 4.09 MIL/uL (ref 3.87–5.11)
RDW: 16.2 % — ABNORMAL HIGH (ref 11.5–15.5)
WBC: 11.9 K/uL — ABNORMAL HIGH (ref 4.0–10.5)
nRBC: 0 % (ref 0.0–0.2)

## 2024-02-02 LAB — GLUCOSE, CAPILLARY: Glucose-Capillary: 74 mg/dL (ref 70–99)

## 2024-02-02 LAB — TYPE AND SCREEN
ABO/RH(D): O NEG
Antibody Screen: POSITIVE

## 2024-02-02 SURGERY — Surgical Case
Anesthesia: Spinal | Site: Abdomen

## 2024-02-02 MED ORDER — OXYTOCIN BOLUS FROM INFUSION: 333.0000 mL | Freq: Once | INTRAVENOUS | Status: DC

## 2024-02-02 MED ORDER — DIBUCAINE (PERIANAL) 1 % EX OINT: 1.0000 | TOPICAL_OINTMENT | CUTANEOUS | Status: DC | PRN

## 2024-02-02 MED ORDER — MENTHOL 3 MG MT LOZG: 1.0000 | LOZENGE | OROMUCOSAL | Status: DC | PRN

## 2024-02-02 MED ORDER — MORPHINE SULFATE (PF) 0.5 MG/ML IJ SOLN
INTRAMUSCULAR | Status: DC | PRN
  Administered 2024-02-02: .1 mg via INTRATHECAL

## 2024-02-02 MED ORDER — SOD CITRATE-CITRIC ACID 500-334 MG/5ML PO SOLN
ORAL | Status: AC
  Filled 2024-02-02: qty 15

## 2024-02-02 MED ORDER — OXYCODONE-ACETAMINOPHEN 5-325 MG PO TABS
2.0000 | ORAL_TABLET | ORAL | Status: DC | PRN
  Administered 2024-02-03 – 2024-02-04 (×5): 2 via ORAL
  Filled 2024-02-02 (×5): qty 2

## 2024-02-02 MED ORDER — SOD CITRATE-CITRIC ACID 500-334 MG/5ML PO SOLN
30.0000 mL | ORAL | Status: DC
  Filled 2024-02-02: qty 30

## 2024-02-02 MED ORDER — LACTATED RINGERS IV SOLN: INTRAVENOUS | Status: DC

## 2024-02-02 MED ORDER — LIDOCAINE HCL (PF) 1 % IJ SOLN: 30.0000 mL | INTRAMUSCULAR | Status: DC | PRN

## 2024-02-02 MED ORDER — BUPIVACAINE 0.25 % ON-Q PUMP DUAL CATH 400 ML
400.0000 mL | INJECTION | Status: DC
  Filled 2024-02-02: qty 400

## 2024-02-02 MED ORDER — KETOROLAC TROMETHAMINE 30 MG/ML IJ SOLN
INTRAMUSCULAR | Status: AC
  Filled 2024-02-02: qty 1

## 2024-02-02 MED ORDER — ONDANSETRON HCL 4 MG/2ML IJ SOLN
INTRAMUSCULAR | Status: DC | PRN
  Administered 2024-02-02: 4 mg via INTRAVENOUS

## 2024-02-02 MED ORDER — KETOROLAC TROMETHAMINE 30 MG/ML IJ SOLN
30.0000 mg | Freq: Four times a day (QID) | INTRAMUSCULAR | Status: AC | PRN
  Administered 2024-02-03: 30 mg via INTRAVENOUS
  Filled 2024-02-02: qty 1

## 2024-02-02 MED ORDER — WITCH HAZEL-GLYCERIN EX PADS: 1.0000 | MEDICATED_PAD | CUTANEOUS | Status: DC | PRN

## 2024-02-02 MED ORDER — ACETAMINOPHEN 325 MG PO TABS: 650.0000 mg | ORAL_TABLET | ORAL | Status: DC | PRN

## 2024-02-02 MED ORDER — PHENYLEPHRINE HCL-NACL 20-0.9 MG/250ML-% IV SOLN
INTRAVENOUS | Status: DC | PRN
  Administered 2024-02-02: 40 ug/min via INTRAVENOUS

## 2024-02-02 MED ORDER — CEFAZOLIN SODIUM-DEXTROSE 2-4 GM/100ML-% IV SOLN
2.0000 g | INTRAVENOUS | Status: DC
  Filled 2024-02-02: qty 100

## 2024-02-02 MED ORDER — ALBUTEROL SULFATE (2.5 MG/3ML) 0.083% IN NEBU: 2.5000 mg | INHALATION_SOLUTION | Freq: Four times a day (QID) | RESPIRATORY_TRACT | Status: DC | PRN

## 2024-02-02 MED ORDER — OXYTOCIN-SODIUM CHLORIDE 30-0.9 UT/500ML-% IV SOLN
2.5000 [IU]/h | INTRAVENOUS | Status: DC
  Administered 2024-02-02: 2.5 [IU]/h via INTRAVENOUS
  Filled 2024-02-02: qty 500

## 2024-02-02 MED ORDER — OXYTOCIN-SODIUM CHLORIDE 30-0.9 UT/500ML-% IV SOLN: 2.5000 [IU]/h | INTRAVENOUS | Status: AC

## 2024-02-02 MED ORDER — FENTANYL CITRATE (PF) 100 MCG/2ML IJ SOLN
INTRAMUSCULAR | Status: AC
  Filled 2024-02-02: qty 2

## 2024-02-02 MED ORDER — OXYCODONE-ACETAMINOPHEN 5-325 MG PO TABS: 1.0000 | ORAL_TABLET | ORAL | Status: DC | PRN

## 2024-02-02 MED ORDER — 0.9 % SODIUM CHLORIDE (POUR BTL) OPTIME
TOPICAL | Status: DC | PRN
  Administered 2024-02-02: 900 mL

## 2024-02-02 MED ORDER — OXYCODONE HCL 5 MG PO TABS
5.0000 mg | ORAL_TABLET | Freq: Four times a day (QID) | ORAL | Status: DC | PRN
  Administered 2024-02-03 (×2): 5 mg via ORAL
  Filled 2024-02-02 (×2): qty 1

## 2024-02-02 MED ORDER — SODIUM CHLORIDE 0.9% FLUSH: 3.0000 mL | INTRAVENOUS | Status: DC | PRN

## 2024-02-02 MED ORDER — SCOPOLAMINE 1 MG/3DAYS TD PT72: 1.0000 | MEDICATED_PATCH | Freq: Once | TRANSDERMAL | Status: DC

## 2024-02-02 MED ORDER — KETOROLAC TROMETHAMINE 30 MG/ML IJ SOLN
INTRAMUSCULAR | Status: DC | PRN
  Administered 2024-02-02: 30 mg via INTRAVENOUS

## 2024-02-02 MED ORDER — SENNOSIDES-DOCUSATE SODIUM 8.6-50 MG PO TABS
2.0000 | ORAL_TABLET | ORAL | Status: DC
  Administered 2024-02-03: 2 via ORAL
  Filled 2024-02-02: qty 2

## 2024-02-02 MED ORDER — NALOXONE HCL 4 MG/10ML IJ SOLN: 1.0000 ug/kg/h | INTRAVENOUS | Status: DC | PRN

## 2024-02-02 MED ORDER — LACTATED RINGERS IV SOLN
500.0000 mL | INTRAVENOUS | Status: DC | PRN
  Administered 2024-02-02: 500 mL via INTRAVENOUS

## 2024-02-02 MED ORDER — IBUPROFEN 600 MG PO TABS
600.0000 mg | ORAL_TABLET | Freq: Four times a day (QID) | ORAL | Status: DC
  Administered 2024-02-03 (×2): 600 mg via ORAL
  Filled 2024-02-02 (×2): qty 1

## 2024-02-02 MED ORDER — COCONUT OIL OIL
1.0000 | TOPICAL_OIL | Status: DC | PRN
  Filled 2024-02-02: qty 7.5

## 2024-02-02 MED ORDER — GENTAMICIN SULFATE 40 MG/ML IJ SOLN
120.0000 mg | INTRAVENOUS | Status: AC
  Administered 2024-02-02: 120 mg via INTRAVENOUS
  Filled 2024-02-02: qty 3

## 2024-02-02 MED ORDER — BUPIVACAINE HCL (PF) 0.5 % IJ SOLN
5.0000 mL | Freq: Once | INTRAMUSCULAR | Status: DC
  Filled 2024-02-02: qty 10

## 2024-02-02 MED ORDER — SIMETHICONE 80 MG PO CHEW
80.0000 mg | CHEWABLE_TABLET | Freq: Three times a day (TID) | ORAL | Status: DC
  Administered 2024-02-03 – 2024-02-04 (×6): 80 mg via ORAL
  Filled 2024-02-02 (×6): qty 1

## 2024-02-02 MED ORDER — DIPHENHYDRAMINE HCL 50 MG/ML IJ SOLN: 12.5000 mg | INTRAMUSCULAR | Status: DC | PRN

## 2024-02-02 MED ORDER — SOD CITRATE-CITRIC ACID 500-334 MG/5ML PO SOLN
30.0000 mL | ORAL | Status: AC
  Administered 2024-02-02: 30 mL via ORAL

## 2024-02-02 MED ORDER — NALOXONE HCL 0.4 MG/ML IJ SOLN: 0.4000 mg | INTRAMUSCULAR | Status: DC | PRN

## 2024-02-02 MED ORDER — FENTANYL CITRATE (PF) 100 MCG/2ML IJ SOLN
INTRAMUSCULAR | Status: DC | PRN
  Administered 2024-02-02: 15 ug via INTRATHECAL

## 2024-02-02 MED ORDER — ACETAMINOPHEN 500 MG PO TABS
1000.0000 mg | ORAL_TABLET | Freq: Four times a day (QID) | ORAL | Status: AC
  Administered 2024-02-02 – 2024-02-03 (×3): 1000 mg via ORAL
  Filled 2024-02-02 (×3): qty 2

## 2024-02-02 MED ORDER — PRENATAL MULTIVITAMIN CH
1.0000 | ORAL_TABLET | Freq: Every day | ORAL | Status: DC
  Administered 2024-02-03 – 2024-02-04 (×2): 1 via ORAL
  Filled 2024-02-02 (×2): qty 1

## 2024-02-02 MED ORDER — OXYTOCIN-SODIUM CHLORIDE 30-0.9 UT/500ML-% IV SOLN
INTRAVENOUS | Status: DC | PRN
  Administered 2024-02-02: 300 mL via INTRAVENOUS

## 2024-02-02 MED ORDER — DIPHENHYDRAMINE HCL 25 MG PO CAPS: 25.0000 mg | ORAL_CAPSULE | ORAL | Status: DC | PRN

## 2024-02-02 MED ORDER — LIDOCAINE HCL (PF) 1 % IJ SOLN
INTRAMUSCULAR | Status: DC | PRN
  Administered 2024-02-02: 3 mL via SUBCUTANEOUS

## 2024-02-02 MED ORDER — MORPHINE SULFATE (PF) 0.5 MG/ML IJ SOLN
INTRAMUSCULAR | Status: AC
  Filled 2024-02-02: qty 10

## 2024-02-02 MED ORDER — FERROUS SULFATE 325 (65 FE) MG PO TABS
325.0000 mg | ORAL_TABLET | Freq: Two times a day (BID) | ORAL | Status: DC
  Administered 2024-02-03 – 2024-02-04 (×4): 325 mg via ORAL
  Filled 2024-02-02 (×4): qty 1

## 2024-02-02 MED ORDER — SOD CITRATE-CITRIC ACID 500-334 MG/5ML PO SOLN: 30.0000 mL | ORAL | Status: DC | PRN

## 2024-02-02 MED ORDER — KETOROLAC TROMETHAMINE 30 MG/ML IJ SOLN: 30.0000 mg | Freq: Four times a day (QID) | INTRAMUSCULAR | Status: AC | PRN

## 2024-02-02 MED ORDER — ONDANSETRON HCL 4 MG/2ML IJ SOLN: 4.0000 mg | Freq: Four times a day (QID) | INTRAMUSCULAR | Status: DC | PRN

## 2024-02-02 MED ORDER — CLINDAMYCIN PHOSPHATE 900 MG/50ML IV SOLN
900.0000 mg | Freq: Three times a day (TID) | INTRAVENOUS | Status: DC
  Administered 2024-02-02: 900 mg via INTRAVENOUS
  Filled 2024-02-02 (×2): qty 50

## 2024-02-02 MED ORDER — BUPIVACAINE HCL (PF) 0.5 % IJ SOLN
INTRAMUSCULAR | Status: DC | PRN
  Administered 2024-02-02: 20 mL

## 2024-02-02 MED ORDER — MEPERIDINE HCL 25 MG/ML IJ SOLN: 6.2500 mg | INTRAMUSCULAR | Status: DC | PRN

## 2024-02-02 MED ORDER — BUPIVACAINE IN DEXTROSE 0.75-8.25 % IT SOLN
INTRATHECAL | Status: DC | PRN
  Administered 2024-02-02: 1.6 mL via INTRATHECAL

## 2024-02-02 SURGICAL SUPPLY — 32 items
BENZOIN TINCTURE PRP APPL 2/3 (GAUZE/BANDAGES/DRESSINGS) ×1 IMPLANT
CATH KIT ON-Q SILVERSOAK 5 (CATHETERS) ×2 IMPLANT
DERMABOND ADVANCED .7 DNX12 (GAUZE/BANDAGES/DRESSINGS) ×1 IMPLANT
DRSG OPSITE POSTOP 4X10 (GAUZE/BANDAGES/DRESSINGS) ×1 IMPLANT
DRSG TEGADERM 4X4.75 (GAUZE/BANDAGES/DRESSINGS) IMPLANT
DRSG TELFA 3X8 NADH STRL (GAUZE/BANDAGES/DRESSINGS) ×1 IMPLANT
ELECT CAUTERY BLADE 6.4 (BLADE) ×1 IMPLANT
ELECTRODE REM PT RTRN 9FT ADLT (ELECTROSURGICAL) ×1 IMPLANT
GAUZE SPONGE 4X4 12PLY STRL (GAUZE/BANDAGES/DRESSINGS) ×1 IMPLANT
GLOVE BIO SURGEON STRL SZ7 (GLOVE) ×1 IMPLANT
GLOVE BIOGEL PI IND STRL 6.5 (GLOVE) IMPLANT
GLOVE BIOGEL PI IND STRL 7.5 (GLOVE) ×1 IMPLANT
GLOVE SURG SYN 6.5 PF PI (GLOVE) IMPLANT
GLOVE SURG SYN 7.0 PF PI (GLOVE) IMPLANT
GLOVE SURG SYN 7.5 PF PI (GLOVE) IMPLANT
GOWN STRL REUS W/ TWL LRG LVL3 (GOWN DISPOSABLE) ×3 IMPLANT
MANIFOLD NEPTUNE II (INSTRUMENTS) ×1 IMPLANT
MAT PREVALON FULL STRYKER (MISCELLANEOUS) ×1 IMPLANT
PACK C SECTION AR (MISCELLANEOUS) ×1 IMPLANT
PAD OB MATERNITY 11 LF (PERSONAL CARE ITEMS) ×2 IMPLANT
PAD PREP OB/GYN DISP 24X41 (PERSONAL CARE ITEMS) ×1 IMPLANT
SCRUB CHG 4% DYNA-HEX 4OZ (MISCELLANEOUS) ×1 IMPLANT
SOLN 0.9% NACL POUR BTL 1000ML (IV SOLUTION) ×1 IMPLANT
SOLN STERILE WATER 500 ML (IV SOLUTION) ×1 IMPLANT
STAPLER INSORB 30 2030 C-SECTI (MISCELLANEOUS) IMPLANT
STRIP CLOSURE SKIN 1/2X4 (GAUZE/BANDAGES/DRESSINGS) ×1 IMPLANT
SUT PDS AB 1 TP1 96 (SUTURE) ×1 IMPLANT
SUT PLAIN GUT 0 (SUTURE) IMPLANT
SUT VIC AB 0 CTX36XBRD ANBCTRL (SUTURE) ×2 IMPLANT
SUT VIC AB 3-0 SH 27X BRD (SUTURE) IMPLANT
SUTURE MNCRL 4-0 27XMF (SUTURE) ×1 IMPLANT
TRAP FLUID SMOKE EVACUATOR (MISCELLANEOUS) ×1 IMPLANT

## 2024-02-02 NOTE — Anesthesia Preprocedure Evaluation (Addendum)
"                                    Anesthesia Evaluation  Patient identified by MRN, date of birth, ID band Patient awake    Reviewed: Allergy & Precautions, NPO status , Patient's Chart, lab work & pertinent test results  History of Anesthesia Complications (+) PONV and history of anesthetic complications  Airway Mallampati: III  TM Distance: >3 FB Neck ROM: full    Dental  (+) Chipped   Pulmonary asthma , Current Smoker   Pulmonary exam normal        Cardiovascular Exercise Tolerance: Good negative cardio ROS Normal cardiovascular exam     Neuro/Psych  PSYCHIATRIC DISORDERS Anxiety Depression       GI/Hepatic negative GI ROS,,,  Endo/Other  diabetes Hyperthyroidism   Renal/GU   negative genitourinary   Musculoskeletal   Abdominal   Peds  Hematology negative hematology ROS (+)   Anesthesia Other Findings Past Medical History: No date: Anxiety No date: Anxiety and depression No date: Complication of anesthesia     Comment:  had difficult time placing epidural 02/2020: COVID-19 No date: Depression No date: Gestational diabetes No date: Migraine without aura No date: Pneumonia  Past Surgical History: 2016: CESAREAN SECTION 09/02/2017: CESAREAN SECTION; N/A     Comment:  Procedure: CESAREAN SECTION;  Surgeon: Leonce Garnette BIRCH, MD;  Location: [facility] ORS;  Service: Obstetrics;                Laterality: N/A; 2014: WISDOM TOOTH EXTRACTION  BMI    Body Mass Index: 30.90 kg/m      Reproductive/Obstetrics (+) Pregnancy                              Anesthesia Physical Anesthesia Plan  ASA: 3 and emergent  Anesthesia Plan: Spinal   Post-op Pain Management:    Induction: Intravenous  PONV Risk Score and Plan: 2 and Ondansetron , Dexamethasone , Propofol infusion, TIVA and Midazolam  Airway Management Planned: Natural Airway and Nasal Cannula  Additional Equipment:   Intra-op Plan:    Post-operative Plan:   Informed Consent: I have reviewed the patients History and Physical, chart, labs and discussed the procedure including the risks, benefits and alternatives for the proposed anesthesia with the patient or authorized representative who has indicated his/her understanding and acceptance.     Dental Advisory Given  Plan Discussed with: Anesthesiologist, CRNA and Surgeon  Anesthesia Plan Comments: (Patient reports no bleeding problems and no anticoagulant use.  Plan for spinal with backup GA  Patient consented for risks of anesthesia including but not limited to:  - adverse reactions to medications - damage to eyes, teeth, lips or other oral mucosa - nerve damage due to positioning  - risk of bleeding, infection and or nerve damage from spinal that could lead to paralysis - risk of headache or failed spinal - damage to teeth, lips or other oral mucosa - sore throat or hoarseness - damage to heart, brain, nerves, lungs, other parts of body or loss of life  Patient voiced understanding and assent.)         Anesthesia Quick Evaluation  "

## 2024-02-02 NOTE — Lactation Note (Signed)
 This note was copied from a baby's chart. Lactation Consultation Note  Patient Name: Alexis Petersen Unijb'd Date: 02/02/2024 Age:41 hours Reason for consult: L&D Initial assessment;Early term 37-38.6wks;Breastfeeding assistance   Maternal Data Does the patient have breastfeeding experience prior to this delivery?: Yes How long did the patient breastfeed?: 1 - 3 to 4 months, 2- 2 to 3 months  Initial assessment w/ a 1hr old baby Alexis and mom.  This was a c-section delivery and moms 3rd baby.   AMA, anxiety, depression, smoker (cigarettes), hx of gestation diabetes, and hx of c-section 2x.    Mom verbalized that she did attempt to breastfeed for about 3-4 months with her other children, but always felt like they were not getting enough.  When she pumped she stated that she never saw much milk as well.    Patient is currently concerned with getting a breastpump and needs some assistance as they have been receiving the run around on how to obtain one.   Feeding Mother's Current Feeding Choice: Breast Milk and Formula  Infant nursing upon entry into L&D room.  Infant with a wide open latch and actively feeding.  LC did not observe any swallows within the time she was present.  LATCH Score Latch: Grasps breast easily, tongue down, lips flanged, rhythmical sucking.  Audible Swallowing: None  Type of Nipple: Everted at rest and after stimulation  Comfort (Breast/Nipple): Soft / non-tender  Hold (Positioning): No assistance needed to correctly position infant at breast.  LATCH Score: 8  Interventions Interventions: Breast feeding basics reviewed;Hand express;Education  LC provided education on the following;  milk production expectations, hunger cues, day 1/2 wet/dirty diapers, and arousing infant for a feeding.  Lactation informed patient of feeding infant at least 8 or more times w/in a 24hr period but not exceeding 3hrs. Patient verbalized understanding.   Discharge Discharge  Education: Outpatient recommendation Pump: Advised to call insurance company [facility] Program: Yes  Consult Status Consult Status: Follow-up Follow-up type: In-patient    Shayden Bobier S Jaycee Pelzer 02/02/2024, 3:31 PM

## 2024-02-02 NOTE — Op Note (Signed)
 Cesarean Section Operative Note    Patient Name: Alexis Petersen  Date of Birth: 07-15-1985  MRN: [id number]  Date of Surgery: 02/02/2024   Pre-operative Diagnosis:  1) History of cesarean delivery, desires repeat 2) Anhydramnios 3) Non-reassuring fetal testing, antepartum 4) Desires sterilization 5) intrauterine pregnancy at [redacted]w[redacted]d   Post-operative Diagnosis:  1) History of cesarean delivery, desires repeat 2) Anhydramnios 3) Non-reassuring fetal testing, antepartum 4) Desires sterilization 5) intrauterine pregnancy at [redacted]w[redacted]d    Procedure:  1) Repeat low transverse cesarean delivery 2) Bilateral tubal ligation using modified Pomeroy method  Surgeon: Surgeons and Role:    DEWAINE Leonce Garnette JONETTA, MD - Primary   Assistants: Edsel Blush, CNM; No other capable assistant available, in surgery requiring high level assistant.  Anesthesia: spinal   Findings:  1) normal appearing gravid uterus, fallopian tubes, and ovaries 2) viable female infant with weight of 2,600 grams, APGARs 9 and 9   Estimated Blood Loss: 500 mL  Total IV Fluids: 900 ml   Urine Output: 100 mL clear urine   Specimens: portion of right and left fallopian tubes  Complications: no complications  Disposition: PACU - hemodynamically stable.   Maternal Condition: stable   Baby condition / location:  Couplet care / Skin to Skin  Procedure Details:  The patient was seen in the Holding Room. The risks, benefits, complications, treatment options, and expected outcomes were discussed with the patient. The patient concurred with the proposed plan, giving informed consent. identified as Alexis Petersen and the procedure verified as C-Section Delivery. A Time Out was held and the above information confirmed.   After induction of anesthesia, the patient was prepped and draped in the usual sterile manner. A Pfannenstiel incision was made and carried down through the subcutaneous tissue to the fascia. Fascial  incision was made and extended transversely. The fascia was separated from the underlying rectus tissue superiorly and inferiorly. The peritoneum was identified and entered. Peritoneal incision was extended longitudinally. The bladder flap was bluntly and sharply freed from the lower uterine segment. A low transverse uterine incision was made and the hysterotomy was extended with cranial-caudal tension. Delivered from cephalic presentation was a 2,600 gram [name] with Apgar scores of 9 at one minute and 9 at five minutes. Cord ph was not sent the umbilical cord was clamped and cut cord blood was obtained for evaluation. The placenta was removed Intact and appeared normal. The uterine outline, tubes and ovaries appeared normal. The uterine incision was closed with running locked sutures of 0 Vicryl.  A second layer of the same suture was thrown in an imbricating fashion.  Hemostasis was assured.    The tubal ligation portion of the procedure was performed at this point.  The left fallopian tube was identified and followed out to the fimbriated end.  A Babcock clamp was used to grasp the tube in the mid-isthmic portion and two 2-0 plain gut sutures were used to ligate the tube.  An approximately 3cm segment of tube was removed with hemostasis assured.  The same procedure was performed on the right fallopian tube with hemostasis noted.   The uterus was returned to the abdomen and the paracolic gutters were cleared of all clots and debris.  The rectus muscles were inspected and found to be hemostatic.  The On-Q catheter pumps were inserted in accordance with the manufacturer's recommendations.  The catheters were inserted approximately 4cm cephelad to the incision line, approximately 1cm apart, straddling the midline.  They were inserted to a depth of the 4th mark. They were positioned superficial to the rectus abdominus muscles and deep to the rectus fascia.    The fascia was then  reapproximated with running sutures of 1-0 PDS, looped. The subcuticular closure was performed using 4-0 monocryl. The skin closure was reinforced using surgical skin glue.  The On-Q catheters were bolused with 5 mL of 0.5% marcaine  plain for a total of 10 mL.  The catheters were affixed to the skin with surgical skin glue, steri-strips, and tegaderm.    The surgical assistant performed tissue retraction, assistance with suturing, and fundal pressure.  Instrument, sponge, and needle counts were correct prior the abdominal closure and were correct at the conclusion of the case.  The patient received Clindamycin  and gentamicin  IV prior to skin incision (within 30 minutes). For VTE prophylaxis she was wearing SCDs throughout the case.  The assistant surgeon was a CNM due to lack of availability of another sales promotion account executive.    Signed: Garnette CHARM Mace, MD 02/02/2024 2:35 PM

## 2024-02-02 NOTE — Discharge Summary (Signed)
 Postpartum Discharge Summary  Patient Name: Alexis Petersen DOB: August 31, 1985 MRN: [id number]  Date of admission: 02/02/2024 Delivery date:02/02/2024 Delivering provider: JACKSON, STEPHEN D Date of discharge: 02/04/2024  [facility] OFE:Ejupzwu'd last menstrual period was 05/19/2023. EDC Estimated Date of Delivery: 02/13/24 Gestational Age at Delivery: [redacted]w[redacted]d   Admitting diagnosis: History of cesarean delivery [Z98.891] Anhydramnios [O41.00X0] Intrauterine pregnancy: [redacted]w[redacted]d     Secondary diagnosis:   Principal Problem:   History of cesarean delivery Active Problems:   Anhydramnios   Gestational diabetes   Non-reassuring fetal status, delivered, current hospitalization   [redacted] weeks gestation of pregnancy   Discharge Diagnosis: Term Pregnancy Delivered                                                Post partum procedures:none Repeat cesarean section Complications: None Delivery Type: repeat cesarean section, low transverse incision Anesthesia: spinal anesthesia Placenta: manual removal To Pathology: No  Laceration: n/a Episiotomy: none  Prenatal Labs:  Blood type/Rh O negative  Antibody screen negative  Rubella Immune  Varicella Immune    RPR NR (09/06/2023, 12/01/2023)  HBsAg negative  Hep C neg  HIV negative (09/06/2023, 12/01/2023)  GC negative  Chlamydia negative  Genetic 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 yo G3P3003 at [redacted]w[redacted]d was admitted to the hospital 02/02/2024 for non-reassuring fetal status and plans made for repeat cesarean section with the following indication:Elective Repeat and Non-Reassuring FHR.Delivery details are as follows:  Membrane Rupture Time/Date: 1:49 PM,02/02/2024  Delivery Method:C-Section, Low Transverse Operative Delivery:N/A Details of operation can be found in separate operative note.  Patient had a postpartum course without  complication.  She is ambulating, tolerating a regular diet, passing flatus, and urinating well. Patient is discharged home in stable condition on  02/04/2024        Newborn Data: [name] Birth date:02/02/2024 Birth time:1:50 PM Gender:Female Living status:Living Apgars:9 ,9  Weight:2600 g    Magnesium Sulfate received: No BMZ received: No Rhophylac :No MMR:No Varivax vaccine given: was not indicated T-DaP:Given prenatally Flu: No  Transfusion:No  Physical exam  Vitals:   02/03/24 1534 02/03/24 2016 02/03/24 2307 02/04/24 0839  BP: 132/85 132/73 127/80 138/83  Pulse: 87 85 91 93  Resp: 18 18 18 17   Temp: 98.8 F (37.1 C) 98.7 F (37.1 C) 98.7 F (37.1 C) 99 F (37.2 C)  TempSrc: Oral Oral Oral Oral  SpO2:  97% 95% 96%  Weight:      Height:       General: alert, cooperative, and no distress Lochia: appropriate Uterine Fundus: firm Perineum: minimal edema/intact Incision: Healing well with no significant drainage, covered with occlusive OP site dressing   DVT Evaluation: No evidence of DVT seen on physical exam.  Labs: Lab Results  Component Value Date   WBC 13.0 (H) 02/03/2024   HGB 10.6 (L) 02/03/2024   HCT 32.0 (L) 02/03/2024   MCV 92.8 02/03/2024   PLT 268 02/03/2024      Latest Ref Rng & Units 03/17/2016    6:24 PM  CMP  Glucose 65 - 99 mg/dL 893   BUN 6 - 20 mg/dL 9   Creatinine 9.55 - 8.99 mg/dL 9.22   Sodium 864 - 854 mmol/L 139  Potassium 3.5 - 5.1 mmol/L 3.7   Chloride 101 - 111 mmol/L 105   CO2 22 - 32 mmol/L 25   Calcium  8.9 - 10.3 mg/dL 9.7    Edinburgh Score:    02/04/2024    8:45 AM  Edinburgh Postnatal Depression Scale Screening Tool  I have been able to laugh and see the funny side of things. 0  I have looked forward with enjoyment to things. 0  I have blamed myself unnecessarily when things went wrong. 1  I have been anxious or worried for no good reason. 2  I have felt scared or panicky for no good reason. 1  Things have been getting  on top of me. 1  I have been so unhappy that I have had difficulty sleeping. 0  I have felt sad or miserable. 0  I have been so unhappy that I have been crying. 0  The thought of harming myself has occurred to me. 0  Edinburgh Postnatal Depression Scale Total 5    Risk assessment for postpartum VTE and prophylactic treatment: Very high risk factors: None High risk factors: None Moderate risk factors: Cesarean delivery   Postpartum VTE prophylaxis with LMWH not indicated  After visit meds:  Allergies as of 02/04/2024       Reactions   Penicillins    Tongue swelling Has patient had a PCN reaction causing immediate rash, facial/tongue/throat swelling, SOB or lightheadedness with hypotension: Yes Has patient had a PCN reaction causing severe rash involving mucus membranes or skin necrosis: No Has patient had a PCN reaction that required hospitalization: Unknown Has patient had a PCN reaction occurring within the last 10 years: No If all of the above answers are NO, then may proceed with Cephalosporin use.   Sulfa Antibiotics    Swollen tongue    Benadryl  [diphenhydramine  Hcl (sleep)] Rash        Medication List     STOP taking these medications    ketorolac  10 MG tablet Commonly known as: TORADOL    norethindrone  0.35 MG tablet Commonly known as: MICRONOR    predniSONE  50 MG tablet Commonly known as: DELTASONE    promethazine -dextromethorphan 6.25-15 MG/5ML syrup Commonly known as: PROMETHAZINE -DM       TAKE these medications    acetaminophen  500 MG tablet Commonly known as: TYLENOL  Take 2 tablets (1,000 mg total) by mouth every 6 (six) hours as needed for moderate pain (pain score 4-6), headache or fever. What changed:  when to take this reasons to take this   albuterol  108 (90 Base) MCG/ACT inhaler Commonly known as: VENTOLIN  HFA Inhale 1-2 puffs into the lungs every 6 (six) hours as needed for wheezing or shortness of breath.   ferrous sulfate  325 (65 FE)  MG tablet Take 1 tablet (325 mg total) by mouth 2 (two) times daily with a meal.   ibuprofen  600 MG tablet Commonly known as: ADVIL  Take 1 tablet (600 mg total) by mouth every 6 (six) hours as needed for fever, mild pain (pain score 1-3) or cramping.   methimazole 5 MG tablet Commonly known as: TAPAZOLE Take 5 mg by mouth 3 (three) times daily.   oxyCODONE  5 MG immediate release tablet Commonly known as: Oxy IR/ROXICODONE  Take 1-2 tablets (5-10 mg total) by mouth every 6 (six) hours as needed for up to 7 days for severe pain (pain score 7-10).   PRENATAL PO Take 1 tablet by mouth daily.   senna-docusate 8.6-50 MG tablet Commonly known as: Senokot-S Take 2 tablets by  mouth at bedtime as needed for mild constipation.   simethicone  80 MG chewable tablet Commonly known as: MYLICON Chew 1 tablet (80 mg total) by mouth 3 (three) times daily after meals.   sodium chloride  0.65 % Soln nasal spray Commonly known as: OCEAN Place 1 spray into both nostrils as needed for congestion.       Discharge home in stable condition Infant Feeding: Breast Infant Disposition:home with mother Discharge instruction: per After Visit Summary and Postpartum booklet. Activity: Advance as tolerated. Pelvic rest for 6 weeks.  Diet: routine diet Anticipated Birth Control: BTL performed in c-section Postpartum Appointment:6 weeks Additional Postpartum F/U: Incision check 1 week Future Appointments: No future appointments.  Follow up Visit:  Follow-up Information     Leonce Garnette BIRCH, MD Follow up in 1 week(s).   Specialty: Obstetrics and Gynecology Why: 1wk incision check Contact information: [address] [place] [address] [phone]                 Plan:  Alexis Petersen was discharged to home in good condition. Follow-up appointment as directed.    Signed:  Edsel Charlies Blush, CNM 02/04/2024 3:35 PM

## 2024-02-02 NOTE — Interval H&P Note (Signed)
 History and Physical Interval Note:  02/02/2024 12:52 PM  KAROLYNN INFANTINO  has presented today for surgery, with the diagnosis of repeat cesarean sterilization.  The various methods of treatment have been discussed with the patient and family. After consideration of risks, benefits and other options for treatment, the patient has consented to  Procedures with comments: CESAREAN SECTION, WITH BILATERAL TUBAL LIGATION (N/A) - REPEAT as a surgical intervention.  The patient's history has been reviewed, patient examined, no change in status, stable for surgery.  I have reviewed the patient's chart and labs.  Questions were answered to the patient's satisfaction.   Patient presented to clinic today for a BPP/AFI.  Patient oligo/anhydramnios with a BPP of 2/8.  She has had a reactive NST, giving her a score of 4/10.  With her fluid level being low/nearly absent, decision made to move to delivery today.  Patient in agreement with plan.  To OR now.   Garnette Mace, MD, [facility] 02/02/2024 12:53 PM

## 2024-02-02 NOTE — Transfer of Care (Signed)
 Immediate Anesthesia Transfer of Care Note  Patient: Alexis Petersen  Procedure(s) Performed: CESAREAN SECTION, WITH BILATERAL TUBAL LIGATION (Abdomen)  Patient Location: PACU and Mother/Baby  Anesthesia Type:Spinal  Level of Consciousness: awake, alert , and oriented  Airway & Oxygen Therapy: Patient Spontanous Breathing  Post-op Assessment: Report given to RN and Post -op Vital signs reviewed and stable  Post vital signs: stable  Last Vitals:  Vitals Value Taken Time  BP    Temp    Pulse    Resp    SpO2      Last Pain:  Vitals:   02/02/24 1153  TempSrc:   PainSc: 0-No pain         Complications: There were no known notable events for this encounter.

## 2024-02-02 NOTE — Plan of Care (Signed)
Initiate POC

## 2024-02-02 NOTE — Anesthesia Procedure Notes (Signed)
 Spinal  Patient location during procedure: OR Start time: 02/02/2024 1:24 PM End time: 02/02/2024 1:26 PM Reason for block: surgical anesthesia  Staffing Performed: resident/CRNA  Authorized by: Myra Lynwood MATSU, MD   Performed by: Jaylene Nest, CRNA  Preanesthetic Checklist Completed: patient identified, IV checked, site marked, risks and benefits discussed, surgical consent, monitors and equipment checked, pre-op evaluation and timeout performed Spinal Block Patient position: sitting Prep: ChloraPrep Patient monitoring: heart rate, continuous pulse ox, blood pressure and cardiac monitor Approach: midline Location: L3-4 Injection technique: single-shot Needle Needle type: Whitacre and Introducer  Needle gauge: 24 G Needle length: 9 cm Assessment Sensory level: T10 Events: CSF return  Additional Notes Sterile aseptic technique used throughout the procedure.  Negative paresthesia. Negative blood return. Positive free-flowing CSF. Expiration date of kit checked and confirmed. Patient tolerated procedure well, without complications.

## 2024-02-03 ENCOUNTER — Encounter: Payer: Self-pay | Admitting: Obstetrics and Gynecology

## 2024-02-03 LAB — CBC
HCT: 32 % — ABNORMAL LOW (ref 36.0–46.0)
Hemoglobin: 10.6 g/dL — ABNORMAL LOW (ref 12.0–15.0)
MCH: 30.7 pg (ref 26.0–34.0)
MCHC: 33.1 g/dL (ref 30.0–36.0)
MCV: 92.8 fL (ref 80.0–100.0)
Platelets: 268 K/uL (ref 150–400)
RBC: 3.45 MIL/uL — ABNORMAL LOW (ref 3.87–5.11)
RDW: 16.6 % — ABNORMAL HIGH (ref 11.5–15.5)
WBC: 13 K/uL — ABNORMAL HIGH (ref 4.0–10.5)
nRBC: 0 % (ref 0.0–0.2)

## 2024-02-03 LAB — FETAL SCREEN: Fetal Screen: NEGATIVE

## 2024-02-03 LAB — SYPHILIS: RPR W/REFLEX TO RPR TITER AND TREPONEMAL ANTIBODIES, TRADITIONAL SCREENING AND DIAGNOSIS ALGORITHM: RPR Ser Ql: NONREACTIVE

## 2024-02-03 MED ORDER — RHO D IMMUNE GLOBULIN 1500 UNIT/2ML IJ SOSY
300.0000 ug | PREFILLED_SYRINGE | Freq: Once | INTRAMUSCULAR | Status: AC
  Administered 2024-02-03: 300 ug via INTRAVENOUS
  Filled 2024-02-03: qty 2

## 2024-02-03 MED ORDER — IBUPROFEN 600 MG PO TABS
600.0000 mg | ORAL_TABLET | Freq: Four times a day (QID) | ORAL | Status: DC
  Administered 2024-02-04 (×3): 600 mg via ORAL
  Filled 2024-02-03 (×3): qty 1

## 2024-02-03 NOTE — Anesthesia Postprocedure Evaluation (Signed)
"   Anesthesia Post Note  Patient: Alexis Petersen  Procedure(s) Performed: CESAREAN SECTION, WITH BILATERAL TUBAL LIGATION (Abdomen)  Patient location during evaluation: PACU Anesthesia Type: Spinal Level of consciousness: awake and alert Pain management: pain level controlled Vital Signs Assessment: post-procedure vital signs reviewed and stable Respiratory status: spontaneous breathing, nonlabored ventilation, respiratory function stable and patient connected to nasal cannula oxygen Cardiovascular status: blood pressure returned to baseline and stable Postop Assessment: no apparent nausea or vomiting Anesthetic complications: no   There were no known notable events for this encounter.   Last Vitals:  Vitals:   02/02/24 2000 02/03/24 0000  BP: 119/75 119/82  Pulse:  80  Resp: 17 16  Temp: 36.7 C 36.7 C  SpO2:  92%    Last Pain:  Vitals:   02/03/24 0458  TempSrc:   PainSc: 6                  Lynwood KANDICE Clause      "

## 2024-02-03 NOTE — Progress Notes (Signed)
 Post Partum Day 1  Subjective: Doing well, no concerns. Ambulating without difficulty, pain managed with PO meds, tolerating regular diet, and voiding without difficulty.   No fever/chills, chest pain, shortness of breath, nausea/vomiting, or leg pain. No nipple or breast pain. No headache, visual changes, or RUQ/epigastric pain.  Objective: BP 115/85 (BP Location: Left Arm)   Pulse 80   Temp 98.5 F (36.9 C) (Oral)   Resp 18   Ht 5' 4 (1.626 m)   Wt 81.6 kg   LMP 05/19/2023   SpO2 97%   Breastfeeding Unknown   BMI 30.90 kg/m    Physical Exam:  General: alert and cooperative Breasts: soft/nontender CV: RRR Pulm: nl effort Abdomen: soft, non-tender Uterine Fundus: firm Incision: healing well Perineum: intact Lochia: appropriate DVT Evaluation: No evidence of DVT seen on physical exam. Edinburgh:     09/08/2017    4:40 PM 02/11/2017   11:01 AM  Edinburgh Postnatal Depression Scale Screening Tool  I have been able to laugh and see the funny side of things. 0  0   I have looked forward with enjoyment to things. 0  0   I have blamed myself unnecessarily when things went wrong. 2  2   I have been anxious or worried for no good reason. 2  3   I have felt scared or panicky for no good reason. 2  2   Things have been getting on top of me. 1  1   I have been so unhappy that I have had difficulty sleeping. 0  0   I have felt sad or miserable. 0  1   I have been so unhappy that I have been crying. 1  1   The thought of harming myself has occurred to me. 0  0   Edinburgh Postnatal Depression Scale Total 8  10      Data saved with a previous flowsheet row definition     Recent Labs    02/02/24 1119 02/03/24 0454  HGB 12.2 10.6*  HCT 36.8 32.0*  WBC 11.9* 13.0*  PLT 360 268    Assessment/Plan: 38 y.o. G3P3003 postpartum day # 1  1. Continue routine postpartum care  2. Infant feeding status: breast feeding -Lactation consult PRN for breastfeeding   3.  Contraception plan: TBD  4. Acute blood loss anemia - clinically not significant .  -Hemodynamically stable and asymptomatic  5. Immunization status:   all immunizations up to date   Disposition: Continue inpatient postpartum care    LOS: 1 day   Orlin Kann, CNM 02/03/2024, 8:33 AM

## 2024-02-03 NOTE — Lactation Note (Signed)
 This note was copied from a baby's chart. Lactation Consultation Note  Patient Name: Alexis Petersen Date: 02/03/2024 Age:41 hours Reason for consult: Follow-up assessment   Maternal Data Lactation to room for a follow up assessment w/ a 18hr old infant and mom.  Mom stated that she just recently finished a feeding.  At one point she was concerned because he did not want to go to the breast but he seems to be doing ok now.  Feeding Mother's Current Feeding Choice: Breast Milk and Formula  Interventions Interventions: Breast feeding basics reviewed;Education;CDC milk storage guidelines  LC reviewed feeding infant on demand but not allowing infant to go past a 3hr window and how to wake him.  Discussed feeding cues with mom, cluster feeding and breastmilk storage guidelines.  LC provided mom w/ a Medicaid Breastfeeding support paper that showed mom how to order a breastpump.   Mom verbalized that she would like a breastpump before discharging. (Hand Pump)  Consult Status Consult Status: Follow-up Follow-up type: In-patient    Jordynn Perrier S Kedra Mcglade 02/03/2024, 10:22 AM

## 2024-02-04 ENCOUNTER — Inpatient Hospital Stay: Admission: RE | Admit: 2024-02-04 | Source: Ambulatory Visit

## 2024-02-04 LAB — SURGICAL PATHOLOGY

## 2024-02-04 LAB — RHOGAM INJECTION: Unit division: 0

## 2024-02-04 MED ORDER — SIMETHICONE 80 MG PO CHEW: 80.0000 mg | CHEWABLE_TABLET | Freq: Three times a day (TID) | ORAL | 0 refills | Status: AC

## 2024-02-04 MED ORDER — ACETAMINOPHEN 500 MG PO TABS: 1000.0000 mg | ORAL_TABLET | Freq: Four times a day (QID) | ORAL | 0 refills | Status: AC | PRN

## 2024-02-04 MED ORDER — SENNOSIDES-DOCUSATE SODIUM 8.6-50 MG PO TABS: 2.0000 | ORAL_TABLET | Freq: Every evening | ORAL | 0 refills | Status: AC | PRN

## 2024-02-04 MED ORDER — OXYCODONE HCL 5 MG PO TABS: 5.0000 mg | ORAL_TABLET | Freq: Four times a day (QID) | ORAL | 0 refills | Status: AC | PRN

## 2024-02-04 MED ORDER — FERROUS SULFATE 325 (65 FE) MG PO TABS: 325.0000 mg | ORAL_TABLET | Freq: Two times a day (BID) | ORAL | 3 refills | Status: AC

## 2024-02-04 MED ORDER — IBUPROFEN 600 MG PO TABS: 600.0000 mg | ORAL_TABLET | Freq: Four times a day (QID) | ORAL | 0 refills | Status: AC | PRN

## 2024-02-04 NOTE — Lactation Note (Signed)
 This note was copied from a baby's chart. Lactation Consultation Note  Patient Name: Alexis Petersen Unijb'd Date: 02/04/2024 Age:41 hours Reason for consult: Follow-up assessment;Early term 37-38.6wks;Infant < 6lbs   Maternal Data This is mom's 3rd baby, C/S for FHR indication.Mom with AMA and a history of A2GDM, tobacco smoker, anxiety and depression.  On follow-up today mom concerned about how to get a breastpump via insurance, tenderness of her nipples, and is the baby latching well. Has patient been taught Hand Expression?: Yes Does the patient have breastfeeding experience prior to this delivery?: Yes How long did the patient breastfeed?: 3-4 months with each of her 2 previous children  Feeding Mother's Current Feeding Choice: Breast Milk Reviewed with mom tips and strategies to maximize position and latch techniques. Baby with tendency to roll upper lip inward. Mom's nipples are intact with some slight redness. Encouraged mom to apply coconut oil to both nipples after each breastfeeding. LATCH Score Latch: Grasps breast easily, tongue down, lips flanged, rhythmical sucking.  Audible Swallowing: Spontaneous and intermittent  Type of Nipple: Everted at rest and after stimulation  Comfort (Breast/Nipple): Filling, red/small blisters or bruises, mild/mod discomfort  Hold (Positioning): No assistance needed to correctly position infant at breast.  LATCH Score: 9   Lactation Tools Discussed/Used  Harmony manual breastpump: use, set-up, cleaning of parts, and milk storage guidelines  Interventions Interventions: Breast feeding basics reviewed;Hand pump;Coconut oil;Education  Discharge Discharge Education: Engorgement and breast care;Warning signs for feeding baby;Outpatient recommendation Pump: Advised to call insurance company;Manual WIC Program: Yes  Consult Status Consult Status: PRN Date: 02/05/24 Follow-up type: In-patient  Update provided to care  nurse.  Avelina DELENA Gaskins 02/04/2024, 12:41 PM

## 2024-02-04 NOTE — Progress Notes (Signed)
 Patient discharged. Discharge instructions given. Patient verbalizes understanding. Transported by staff.

## 2024-02-04 NOTE — Clinical Social Work Maternal (Signed)
 " CLINICAL SOCIAL WORK MATERNAL/CHILD NOTE  Patient Details  Name: Alexis Petersen MRN: [id number] Date of Birth: [birth year]  Date:  02/04/2024  Clinical Social Worker Initiating Note:  Corrie Ruts Date/Time: Initiated:  02/04/24/1115     Child's Name:  Alexis Petersen   Biological Parents:  Mother, Father   Need for Interpreter:  None   Reason for Referral:  Other (Comment) (finacial assistance)   Address:  [phone] [place] [address]    Phone number:  [phone] (home)     Additional phone number:   Household Members/Support Persons (HM/SP):   Household Member/Support Person 1, Household Member/Support Person 2, Household Member/Support Person 3   HM/SP Name Relationship DOB or Age  HM/SP -1 Alverta Caccamo FOB 56  HM/SP -2 Alexcis Bicking Son of the patient 9  HM/SP -3 Stormey Wilborn Daughter of the patient 6  HM/SP -4        HM/SP -5        HM/SP -6        HM/SP -7        HM/SP -8          Natural Supports (not living in the home):  Parent, Immediate Family   Professional Supports:     Employment: Part-time   Type of Work: 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 Psychotherapist:  Other (comment) (Associates Degree)   Homebound arranged:    Surveyor, Quantity Resources:  Medicaid   Other Resources:  [facility]   Cultural/Religious Considerations Which May Impact Care:    Strengths:  Ability to meet basic needs  , Compliance with medical plan  , Home prepared for child  , Pediatrician chosen   Psychotropic Medications:         Pediatrician:    [facility]  Pediatrician List:   [facility]    [place]    [facility]  [facility]      Pediatrician Fax Number:    Risk Factors/Current Problems:  Other (Comment) Barrister's Clerk Assistance)   Cognitive State:  Alert     Mood/Affect:  Calm     CSW Assessment:  Chart reviewed. I received a consult for financial assistance resources. I was able to  speak with the patient and the patient spouse at bedside today. I introduced myself, my role, and reason for consult.   The patient reports that she was doing good after delivery. The patient confirms that the FOB is Alexis Petersen. The patient confirms her address is [address] [place], [address] and telephone number is [phone]. The patient reports that she has support from her parents and her husband parents outside of the home. The patient reports that she lives in the home with her husband and their 2 kids.   The patient reports that she does receive WIC and will reapply for food stamps. The patient reports that her highest level of education is an associates degree and she works part time at [employer] The patient reports that she is planning to breast feed and needs a breast pump. SW directed the patient to speak with WIC case worker and insurance company for breast pump assistance. The patient verbalized understanding.   The patient reports that she has a PCP and the baby will go to [facility] for medical appointments. The patient reports that she has anexity in the past but it was from birth control. The patient reports that she is  not taking any medications or active in therapy. The patient denies any past and current SI/HI/DV.   The patient reports that  she has a crib, bassinet, pack and play, diapers, clothes, and car seat for the baby. The patient reports that her husband will assist her at D/C.   SW reviewed information on Post partum depression, Sudden infant death syndrome, mental health resources, safe sleep environment, car seat safety, post partum resources, DSS contact information and financial assistance resources. Patient verbalized understanding.   I inquired about the patient needing any financial assistance and housing needs. The patient denied needing anything during the time of the consult. SW encouraged the patient to reach out to [facility] before D/C  for any other concerns or questions. Patient verbalized understanding.   CSW Plan/Description:  Sudden Infant Death Syndrome (SIDS) Education, Perinatal Mood and Anxiety Disorder (PMADs) Education, Other Information/Referral to [facility]    K'La JINNY Ruts, LCSW 02/04/2024, 11:47 AM  "

## 2024-02-04 NOTE — Progress Notes (Addendum)
 Post Partum Day 2 Subjective: Doing well.  Tolerating regular diet, pain with PO meds, voiding and ambulating without difficulty. Reports has not passed gas since delivery and feels distended and uncomfortable. Has taken Simethicone  and ambulating.  No CP SOB Fever,Chills, N/V or leg pain; denies nipple or breast pain, no HA change of vision, RUQ/epigastric pain  Objective: BP 138/83 (BP Location: Right Arm)   Pulse 93   Temp 99 F (37.2 C) (Oral)   Resp 17   Ht 5' 4 (1.626 m)   Wt 81.6 kg   LMP 05/19/2023   SpO2 96%   Breastfeeding Unknown   BMI 30.90 kg/m    Physical Exam:  General: NAD Breasts: soft/nontender CV: RRR Pulm: nl effort, CTABL Abdomen: distended, NT, BS x 4 hyperactive Incision: Dsg CDI/Honeycomb intact/no erythema or drainage, OnQ pump in place and dressing CDI Lochia: moderate Uterine Fundus: fundus firm and 1 fb below umbilicus DVT Evaluation: no cords, ttp LEs   Recent Labs    02/02/24 1119 02/03/24 0454  HGB 12.2 10.6*  HCT 36.8 32.0*  WBC 11.9* 13.0*  PLT 360 268    Assessment/Plan: 38 y.o. G3P3003 postpartum day # 2  - Continue routine PP care - Lactation consult PRN.  - Discussed contraceptive options including implant, IUDs hormonal and non-hormonal, injection, pills/ring/patch, condoms, and NFP.  - Acute blood loss anemia, clinically significant - hemoglobin changed from 12.2 to 10.6, patient is asymptomatic, hemodynamically stable; start po ferrous sulfate  BID with stool softeners - abdominal distension, not passing gas, took Simethicone  and walked around the nurses station 2x, now having hyperactive bowel sounds, Dr. Leonce notified. RN to notify CNM once patient passes gas.  - Immunization status: all Imms up to date  Disposition: Does not desire Dc home today.   Alexis Petersen, CNM 02/04/2024 11:00 AM

## 2024-02-04 NOTE — Discharge Instructions (Signed)
Discharge instructions Bleeding: Your bleeding could continue up to 6 weeks, the flow should gradually decrease and the color should become dark then lightened over the next couple of weeks. If you notice you are bleeding heavily or passing clots larger than the size of your fist, PLEASE call your physician. No TAMPONS, DOUCHING, ENEMAS OR SEXUAL INTERCOURSE for 6 weeks. Incision: The honeycomb dressing can be removed in 5-7 days. Remove earlier if any water gets underneath it or if there is a lot of drainage. After the dressing is off, you can let the warm soapy water from the shower run over the incision and pat dry with a clean towel. Watch the incision for signs of infection, such as, redness, warmth, oozing pus. AfterPains: This is the uterus contracting back to its normal position and size. Use medications prescribed or recommended by your physician to help relieve this discomfort. Bowels/Hemorrhoids: Drink plenty of water and stay active. Increase fiber, fresh fruits and vegetables in your diet. Rest/Activity: Rest when the baby is resting;  Do not lift > 10 lbs for 6 weeks. No driving for 1-2 weeks. Bathing: Shower daily! Diet: Continue daily prenatal vitamin and iron until your follow up visit to help replenish nutrients and vitamins. If breastfeeding eat extra calories and increase your fluid intake to 12 glasses a day. Contraception: Consult with your provider on what method of birth control you would like to use. Breastfeeding: You may have a slight fever when your milk comes in, but it should go away on its own. If it does not, and rises above 101.0 please call the doctor. Bottlefeeding: wear a snug fitting bra without underwires continuously for 3-5days, avoid any nipple/breast stimulation. If engorgement occurs, take ibuprofen as prescribed and apply fresh green cabbage leaves directly to your breasts inside the bra cups. Postpartum "BLUES": It is common to emotional days after delivery,  however if it persist for greater than 2 weeks or if you feel concerned please let your physician know immediately. This is hormone driven and nothing you can control so please let someone know how you feel. Follow Up Visit: Please schedule a follow up visit with your delivering provider  Call office if you have any of the following: headache, visual changes, fever >101.0 F, chills, breast concerns, excessive vaginal bleeding, incision drainage or problems, leg pain or redness, depression or any other concerns.  For concerns about your baby, please call your pediatrician For breastfeeding concerns, the lactation consultant can be reached at [phone]

## 2024-02-05 ENCOUNTER — Other Ambulatory Visit: Payer: Self-pay

## 2024-02-05 ENCOUNTER — Observation Stay
Admission: EM | Admit: 2024-02-05 | Discharge: 2024-02-05 | Disposition: A | Discharge status: Home / Self Care | Attending: Obstetrics and Gynecology | Admitting: Obstetrics and Gynecology

## 2024-02-05 DIAGNOSIS — F17210 Nicotine dependence, cigarettes, uncomplicated: Secondary | ICD-10-CM | POA: Insufficient documentation

## 2024-02-05 DIAGNOSIS — Z8616 Personal history of COVID-19: Secondary | ICD-10-CM | POA: Diagnosis not present

## 2024-02-05 DIAGNOSIS — O24419 Gestational diabetes mellitus in pregnancy, unspecified control: Secondary | ICD-10-CM | POA: Diagnosis not present

## 2024-02-05 DIAGNOSIS — O9089 Other complications of the puerperium, not elsewhere classified: Principal | ICD-10-CM | POA: Diagnosis present

## 2024-02-05 DIAGNOSIS — Z3A38 38 weeks gestation of pregnancy: Secondary | ICD-10-CM | POA: Insufficient documentation

## 2024-02-05 DIAGNOSIS — O4103X0 Oligohydramnios, third trimester, not applicable or unspecified: Secondary | ICD-10-CM | POA: Diagnosis not present

## 2024-02-05 MED ORDER — PRENATAL MULTIVITAMIN CH
1.0000 | ORAL_TABLET | Freq: Every day | ORAL | Status: DC
  Filled 2024-02-05: qty 1

## 2024-02-05 MED ORDER — ACETAMINOPHEN 325 MG PO TABS: 650.0000 mg | ORAL_TABLET | ORAL | Status: DC | PRN

## 2024-02-05 MED ORDER — CALCIUM CARBONATE ANTACID 500 MG PO CHEW: 2.0000 | CHEWABLE_TABLET | ORAL | Status: DC | PRN

## 2024-02-05 NOTE — Discharge Summary (Signed)
 " Alexis Petersen is a 39 y.o. female. She is 3 days post-operative. Patient's last menstrual period was 05/19/2023.  Prenatal care site: [facility]  Current pregnancy complicated by:    Anhydramnios   Gestational diabetes   Non-reassuring fetal status, delivered, current hospitalization   [redacted] weeks gestation of pregnancy  Chief complaint: incision bleeding  She reports last night she was coughing and this morning believes she pulled a stitch in her incision. She reports she is bleeding from her incision but denies any increase in pain. Her pain is well controlled with her post-operative prescriptions.  S: Resting comfortably. no CTX, no VB.no LOF,  Active fetal movement.  Denies: HA, visual changes, SOB, or RUQ/epigastric pain  Maternal Medical History:   Past Medical History:  Diagnosis Date   Anxiety    Anxiety and depression    Complication of anesthesia    had difficult time placing epidural   COVID-19 02/2020   Depression    Gestational diabetes    Migraine without aura    Pneumonia     Past Surgical History:  Procedure Laterality Date   CESAREAN SECTION  2016   CESAREAN SECTION N/A 09/02/2017   Procedure: CESAREAN SECTION;  Surgeon: Leonce Garnette BIRCH, MD;  Location: [facility] ORS;  Service: Obstetrics;  Laterality: N/A;   CESAREAN SECTION WITH BILATERAL TUBAL LIGATION N/A 02/02/2024   Procedure: CESAREAN SECTION, WITH BILATERAL TUBAL LIGATION;  Surgeon: Leonce Garnette BIRCH, MD;  Location: [facility] OR[facility];  Service: Obstetrics;  Laterality: N/A;  REPEAT   WISDOM TOOTH EXTRACTION  2014    Allergies[1]  Prior to Admission medications  Medication Sig Start Date End Date Taking? Authorizing Provider  acetaminophen  (TYLENOL ) 500 MG tablet Take 2 tablets (1,000 mg total) by mouth every 6 (six) hours as needed for moderate pain (pain score 4-6), headache or fever. 02/04/24  Yes Tanda Edsel Fuller, CNM  ferrous sulfate  325 (65 FE) MG tablet Take 1 tablet (325 mg total) by  mouth 2 (two) times daily with a meal. 02/04/24  Yes Reannah Totten, Edsel Fuller, CNM  ibuprofen  (ADVIL ) 600 MG tablet Take 1 tablet (600 mg total) by mouth every 6 (six) hours as needed for fever, mild pain (pain score 1-3) or cramping. 02/04/24  Yes Kaysa Roulhac Renee, CNM  methimazole (TAPAZOLE) 5 MG tablet Take 5 mg by mouth 3 (three) times daily.   Yes [provider]  oxyCODONE  (OXY IR/ROXICODONE ) 5 MG immediate release tablet Take 1-2 tablets (5-10 mg total) by mouth every 6 (six) hours as needed for up to 7 days for severe pain (pain score 7-10). 02/04/24 02/11/24 Yes Tanda Edsel Fuller, CNM  Prenatal Vit-Fe Fumarate-FA (PRENATAL PO) Take 1 tablet by mouth daily.   Yes [provider]  senna-docusate (SENOKOT-S) 8.6-50 MG tablet Take 2 tablets by mouth at bedtime as needed for mild constipation. 02/04/24  Yes Matas Burrows Renee, CNM  simethicone  (MYLICON) 80 MG chewable tablet Chew 1 tablet (80 mg total) by mouth 3 (three) times daily after meals. 02/04/24  Yes Dainel Arcidiacono, Edsel Fuller, CNM  albuterol  (VENTOLIN  HFA) 108 [phone] Base) MCG/ACT inhaler Inhale 1-2 puffs into the lungs every 6 (six) hours as needed for wheezing or shortness of breath. 03/14/20   Cook, Jayce G, DO  sodium chloride  (OCEAN) 0.65 % SOLN nasal spray Place 1 spray into both nostrils as needed for congestion.    [provider]      Social History: She  reports that she has been smoking cigarettes. She  has a 6 pack-year smoking history. She has been exposed to tobacco smoke. She has never used smokeless tobacco. She reports that she does not currently use drugs after having used the following drugs: Marijuana. She reports that she does not drink alcohol.  Family History: family history includes Hypertension in her father; Stroke in her father.  no history of gyn cancers  Review of Systems: A full review of systems was performed and negative except as noted in the HPI.    O:  Temp 98.4 F (36.9 C)  (Oral)   Resp 18   LMP 05/19/2023  No results found for this or any previous visit (from the past 48 hours).   Constitutional: NAD, AAOx3  HE/ENT: extraocular movements grossly intact, moist mucous membranes CV: RRR PULM: nl respiratory effort, CTABL     Abd: gravid, non-tender, non-distended, soft, serosanguinous drainage noted from OnQ Pump insertion site on the abdomen, no frank bleeding noted, no redness, Honeycomb dressing over cesarean section c/d/i      Ext: Non-tender, Nonedematous   Psych: mood appropriate, speech normal Pelvic: deferred  A/P: 39 y.o. 3 days post-operative here for postpartum surveillance for incisional bleeding  Principle Diagnosis:  41anguinous drainage, normal postpartum  OnQ pump dressing removed, OnQ pump removed with ease, puncture sites on abdomen covered with sterile 2 x 2 gauze and tape placed over it. Drainage controlled, no active bleeding noted. Some of the drainage from the OnQ pump reached the Honeycomb dressing, so Honeycomb dressing replaced as well. Cesarean section incision healing well. D/c home stable, precautions reviewed, follow-up as scheduled.    Edsel Charlies Blush, CNM 02/05/2024 2:12 PM     [1]  Allergies Allergen Reactions   Penicillins     Tongue swelling Has patient had a PCN reaction causing immediate rash, facial/tongue/throat swelling, SOB or lightheadedness with hypotension: Yes Has patient had a PCN reaction causing severe rash involving mucus membranes or skin necrosis: No Has patient had a PCN reaction that required hospitalization: Unknown Has patient had a PCN reaction occurring within the last 10 years: No If all of the above answers are NO, then may proceed with Cephalosporin use.    Sulfa Antibiotics     Swollen tongue    Benadryl  [Diphenhydramine  Hcl (Sleep)] Rash   "

## 2024-02-05 NOTE — Progress Notes (Signed)
 Honeycomb dressing removed and reapplied per CNM order. Wound under honey comb was CDI patient discharged home in wheelchair, verbalized understanding of instructions.

## 2024-02-06 ENCOUNTER — Encounter: Payer: Self-pay | Admitting: Obstetrics and Gynecology

## 2024-02-07 ENCOUNTER — Ambulatory Visit: Admission: RE | Admit: 2024-02-07 | Source: Home / Self Care | Admitting: Obstetrics and Gynecology

## 2024-02-07 DIAGNOSIS — Z98891 History of uterine scar from previous surgery: Secondary | ICD-10-CM
